# Patient Record
Sex: Female | Born: 1965 | Race: White | Hispanic: No | Smoking: Never smoker
Health system: Southern US, Community
[De-identification: ages and names within clinical notes are randomized; demographics above are authoritative.]

## PROBLEM LIST (undated history)

## (undated) DIAGNOSIS — R232 Flushing: Secondary | ICD-10-CM

## (undated) DIAGNOSIS — R519 Headache, unspecified: Secondary | ICD-10-CM

## (undated) DIAGNOSIS — Z9889 Other specified postprocedural states: Secondary | ICD-10-CM

## (undated) DIAGNOSIS — T4145XA Adverse effect of unspecified anesthetic, initial encounter: Secondary | ICD-10-CM

## (undated) DIAGNOSIS — R112 Nausea with vomiting, unspecified: Secondary | ICD-10-CM

## (undated) DIAGNOSIS — T8859XA Other complications of anesthesia, initial encounter: Secondary | ICD-10-CM

## (undated) DIAGNOSIS — J45909 Unspecified asthma, uncomplicated: Secondary | ICD-10-CM

## (undated) DIAGNOSIS — C50919 Malignant neoplasm of unspecified site of unspecified female breast: Secondary | ICD-10-CM

## (undated) DIAGNOSIS — IMO0002 Reserved for concepts with insufficient information to code with codable children: Secondary | ICD-10-CM

## (undated) DIAGNOSIS — R51 Headache: Secondary | ICD-10-CM

## (undated) DIAGNOSIS — R87619 Unspecified abnormal cytological findings in specimens from cervix uteri: Secondary | ICD-10-CM

## (undated) DIAGNOSIS — M1990 Unspecified osteoarthritis, unspecified site: Secondary | ICD-10-CM

## (undated) DIAGNOSIS — A630 Anogenital (venereal) warts: Secondary | ICD-10-CM

## (undated) DIAGNOSIS — S20219A Contusion of unspecified front wall of thorax, initial encounter: Secondary | ICD-10-CM

## (undated) DIAGNOSIS — K219 Gastro-esophageal reflux disease without esophagitis: Secondary | ICD-10-CM

## (undated) DIAGNOSIS — F419 Anxiety disorder, unspecified: Secondary | ICD-10-CM

## (undated) HISTORY — DX: Flushing: R23.2

## (undated) HISTORY — PX: CERVICAL CONE BIOPSY: SUR198

## (undated) HISTORY — DX: Anogenital (venereal) warts: A63.0

## (undated) HISTORY — DX: Headache, unspecified: R51.9

## (undated) HISTORY — PX: DILATION AND CURETTAGE OF UTERUS: SHX78

## (undated) HISTORY — DX: Unspecified abnormal cytological findings in specimens from cervix uteri: R87.619

## (undated) HISTORY — DX: Headache: R51

## (undated) HISTORY — DX: Unspecified osteoarthritis, unspecified site: M19.90

## (undated) HISTORY — DX: Reserved for concepts with insufficient information to code with codable children: IMO0002

## (undated) HISTORY — PX: CERVIX LESION DESTRUCTION: SHX591

## (undated) HISTORY — DX: Malignant neoplasm of unspecified site of unspecified female breast: C50.919

## (undated) HISTORY — PX: COLPOSCOPY: SHX161

---

## 1999-05-04 ENCOUNTER — Other Ambulatory Visit: Admission: RE | Admit: 1999-05-04 | Discharge: 1999-05-04 | Payer: Self-pay | Admitting: Obstetrics and Gynecology

## 1999-09-13 ENCOUNTER — Encounter: Payer: Self-pay | Admitting: Family Medicine

## 1999-09-13 ENCOUNTER — Ambulatory Visit (HOSPITAL_COMMUNITY): Admission: RE | Admit: 1999-09-13 | Discharge: 1999-09-13 | Payer: Self-pay | Admitting: Family Medicine

## 1999-09-22 ENCOUNTER — Encounter: Payer: Self-pay | Admitting: Emergency Medicine

## 1999-09-22 ENCOUNTER — Emergency Department (HOSPITAL_COMMUNITY): Admission: EM | Admit: 1999-09-22 | Discharge: 1999-09-22 | Payer: Self-pay | Admitting: Emergency Medicine

## 2000-08-21 ENCOUNTER — Other Ambulatory Visit: Admission: RE | Admit: 2000-08-21 | Discharge: 2000-08-21 | Payer: Self-pay | Admitting: Obstetrics and Gynecology

## 2000-09-25 ENCOUNTER — Other Ambulatory Visit: Admission: RE | Admit: 2000-09-25 | Discharge: 2000-09-25 | Payer: Self-pay | Admitting: Obstetrics and Gynecology

## 2001-07-15 ENCOUNTER — Other Ambulatory Visit: Admission: RE | Admit: 2001-07-15 | Discharge: 2001-07-15 | Payer: Self-pay | Admitting: Obstetrics and Gynecology

## 2003-05-17 ENCOUNTER — Other Ambulatory Visit: Admission: RE | Admit: 2003-05-17 | Discharge: 2003-05-17 | Payer: Self-pay | Admitting: Obstetrics and Gynecology

## 2003-11-22 ENCOUNTER — Other Ambulatory Visit: Admission: RE | Admit: 2003-11-22 | Discharge: 2003-11-22 | Payer: Self-pay | Admitting: Obstetrics and Gynecology

## 2004-01-27 ENCOUNTER — Other Ambulatory Visit: Admission: RE | Admit: 2004-01-27 | Discharge: 2004-01-27 | Payer: Self-pay | Admitting: Obstetrics and Gynecology

## 2004-06-13 ENCOUNTER — Other Ambulatory Visit: Admission: RE | Admit: 2004-06-13 | Discharge: 2004-06-13 | Payer: Self-pay | Admitting: Obstetrics and Gynecology

## 2004-06-15 ENCOUNTER — Ambulatory Visit (HOSPITAL_COMMUNITY): Admission: RE | Admit: 2004-06-15 | Discharge: 2004-06-15 | Payer: Self-pay | Admitting: Obstetrics and Gynecology

## 2004-07-04 ENCOUNTER — Encounter: Admission: RE | Admit: 2004-07-04 | Discharge: 2004-07-04 | Payer: Self-pay | Admitting: Obstetrics and Gynecology

## 2004-07-27 ENCOUNTER — Encounter (INDEPENDENT_AMBULATORY_CARE_PROVIDER_SITE_OTHER): Payer: Self-pay | Admitting: *Deleted

## 2004-07-27 ENCOUNTER — Ambulatory Visit (HOSPITAL_COMMUNITY): Admission: RE | Admit: 2004-07-27 | Discharge: 2004-07-27 | Payer: Self-pay | Admitting: Obstetrics and Gynecology

## 2004-12-18 ENCOUNTER — Other Ambulatory Visit: Admission: RE | Admit: 2004-12-18 | Discharge: 2004-12-18 | Payer: Self-pay | Admitting: Obstetrics and Gynecology

## 2005-04-17 ENCOUNTER — Other Ambulatory Visit: Admission: RE | Admit: 2005-04-17 | Discharge: 2005-04-17 | Payer: Self-pay | Admitting: Obstetrics and Gynecology

## 2005-08-22 ENCOUNTER — Other Ambulatory Visit: Admission: RE | Admit: 2005-08-22 | Discharge: 2005-08-22 | Payer: Self-pay | Admitting: Obstetrics and Gynecology

## 2005-08-23 ENCOUNTER — Ambulatory Visit (HOSPITAL_COMMUNITY): Admission: RE | Admit: 2005-08-23 | Discharge: 2005-08-23 | Payer: Self-pay | Admitting: Obstetrics and Gynecology

## 2010-02-13 ENCOUNTER — Ambulatory Visit: Payer: Self-pay | Admitting: Family Medicine

## 2010-02-13 DIAGNOSIS — J301 Allergic rhinitis due to pollen: Secondary | ICD-10-CM | POA: Insufficient documentation

## 2010-02-13 DIAGNOSIS — J069 Acute upper respiratory infection, unspecified: Secondary | ICD-10-CM | POA: Insufficient documentation

## 2010-02-13 DIAGNOSIS — J0100 Acute maxillary sinusitis, unspecified: Secondary | ICD-10-CM | POA: Insufficient documentation

## 2010-02-18 ENCOUNTER — Telehealth (INDEPENDENT_AMBULATORY_CARE_PROVIDER_SITE_OTHER): Payer: Self-pay | Admitting: *Deleted

## 2010-03-30 NOTE — Assessment & Plan Note (Signed)
Summary: SINUS PROBLEMS,PRESSURE/TJ rm 5   Vital Signs:  Patient Profile:   45 Years Old Female CC:      possible sinus infection Height:     69 inches Weight:      195 pounds O2 Sat:      100 % O2 treatment:    Room Air Temp:     99.1 degrees F oral Pulse rate:   77 / minute Resp:     18 per minute BP sitting:   156 / 97  (left arm) Cuff size:   regular  Vitals Entered By: Clemens Catholic LPN (February 13, 2010 5:58 PM)                  Updated Prior Medication List: CLARITIN 10 MG TABS (LORATADINE)   Current Allergies: ! SULFA ! * DUST ! * POLLENHistory of Present Illness Chief Complaint: possible sinus infection History of Present Illness:  Subjective: Patient complains of mild sore throat about 3 weeks ago, followed by an increase in her usual nasal congestion.  She has a history of perennial rhinitis, now not responding to Claritin.  She had had some pressure in her ears, and now has facial pain. + occasional cough No pleuritic pain No wheezing  + post-nasal drainage + sinus pain/pressure No itchy/red eyes ? earache No hemoptysis No SOB No fever/chills No nausea No vomiting No abdominal pain No diarrhea No skin rashes + fatigue No myalgias + headache Used OTC meds without relief   REVIEW OF SYSTEMS Constitutional Symptoms       Complains of fever, chills, and night sweats.     Denies weight loss, weight gain, and fatigue.  Eyes       Denies change in vision, eye pain, eye discharge, glasses, contact lenses, and eye surgery. Ear/Nose/Throat/Mouth       Complains of sinus problems and sore throat.      Denies hearing loss/aids, change in hearing, ear pain, ear discharge, dizziness, frequent runny nose, frequent nose bleeds, hoarseness, and tooth pain or bleeding.  Respiratory       Denies dry cough, productive cough, wheezing, shortness of breath, asthma, bronchitis, and emphysema/COPD.  Cardiovascular       Denies murmurs, chest pain, and tires  easily with exhertion.    Gastrointestinal       Denies stomach pain, nausea/vomiting, diarrhea, constipation, blood in bowel movements, and indigestion. Genitourniary       Denies painful urination, kidney stones, and loss of urinary control. Neurological       Denies paralysis, seizures, and fainting/blackouts. Musculoskeletal       Denies muscle pain, joint pain, joint stiffness, decreased range of motion, redness, swelling, muscle weakness, and gout.  Skin       Denies bruising, unusual mles/lumps or sores, and hair/skin or nail changes.  Psych       Denies mood changes, temper/anger issues, anxiety/stress, speech problems, depression, and sleep problems. Other Comments: pt c/o sinus pressure, fever, sore throat (drainage), green drainage,decreased appetite x 2-3wks. pt states that she has a hx of sinus infections. she has tried OTC Afrin with no help.   Past History:  Past Medical History: Unremarkable  Past Surgical History: Denies surgical history  Family History: mom- irregular heart rate  Social History: Never Smoked Alcohol use-yes 1 glass of wine per day Drug use-no Smoking Status:  never Drug Use:  no   Objective:  Appearance:  Patient appears healthy, stated age, and in no acute distress  Eyes:  Pupils are equal, round, and reactive to light and accomdation.  Extraocular movement is intact.  Conjunctivae are not inflamed.  Nose:  Normal septum.  Normal turbinates, mildly congested.  Maxillary sinus tenderness present.  Pharynx:  Normal  Neck:  Supple.  Slightly tender shotty anterior/posterior nodes are palpated bilaterally.  Lungs:  Clear to auscultation.  Breath sounds are equal.  Heart:  Regular rate and rhythm without murmurs, rubs, or gallops.  Abdomen:  Nontender without masses or hepatosplenomegaly.  Bowel sounds are present.  No CVA or flank tenderness.  Extremities:  No edema.   Skin:  No rash Assessment New Problems: ACUTE MAXILLARY SINUSITIS  (ICD-461.0) ALLERGIC RHINITIS, SEASONAL (ICD-477.0) UPPER RESPIRATORY INFECTION, ACUTE (ICD-465.9)  ACUTE VIRAL URI WITH SINUSITIS  Plan New Medications/Changes: BENZONATATE 200 MG CAPS (BENZONATATE) One by mouth hs as needed cough  #12 x 0, 02/13/2010, Donna Christen MD FLUTICASONE PROPIONATE 50 MCG/ACT SUSP (FLUTICASONE PROPIONATE) 2 sprays in each nostril once daily  #One x 1, 02/13/2010, Donna Christen MD AMOXICILLIN 875 MG TABS (AMOXICILLIN) One by mouth two times a day  #28 x 0, 02/13/2010, Donna Christen MD  New Orders: New Patient Level III [phone] Planning Comments:   Begin amoxicillin, expectorant/decongestant, fluticasone nasal spray, cough suppressant at bedtime.  Increase fluid intake Stop Claritin for now. Followup with PCP if not improving 10 to 14 days.   The patient and/or caregiver has been counseled thoroughly with regard to medications prescribed including dosage, schedule, interactions, rationale for use, and possible side effects and they verbalize understanding.  Diagnoses and expected course of recovery discussed and will return if not improved as expected or if the condition worsens. Patient and/or caregiver verbalized understanding.  Prescriptions: BENZONATATE 200 MG CAPS (BENZONATATE) One by mouth hs as needed cough  #12 x 0   Entered and Authorized by:   Donna Christen MD   Signed by:   Donna Christen MD on 02/13/2010   Method used:   Print then Give to Patient   RxID:   [id number] FLUTICASONE PROPIONATE 50 MCG/ACT SUSP (FLUTICASONE PROPIONATE) 2 sprays in each nostril once daily  #One x 1   Entered and Authorized by:   Donna Christen MD   Signed by:   Donna Christen MD on 02/13/2010   Method used:   Print then Give to Patient   RxID:   [id number] AMOXICILLIN 875 MG TABS (AMOXICILLIN) One by mouth two times a day  #28 x 0   Entered and Authorized by:   Donna Christen MD   Signed by:   Donna Christen MD on 02/13/2010   Method used:   Print then Give to  Patient   RxID:   [id number]   Patient Instructions: 1)  May use Mucinex D (guaifenesin with decongestant) twice daily for congestion. 2)  Increase fluid intake, rest. 3)  Stop Claritin for now. 4)  May use Afrin nasal spray (or generic oxymetazoline) twice daily for about 5 days.  Use the Afrin about 15 minutes prior to using fluticasone nasal spray.   Also recommend using saline nasal spray several times daily and/or saline nasal irrigation. 5)  Followup with family doctor if not improving 10 to 14 days.  Orders Added: 1)  New Patient Level III [47425]

## 2010-03-30 NOTE — Progress Notes (Signed)
  Phone Note Outgoing Call   Call placed by: Lajean Saver RN,  February 18, 2010 11:02 AM Call placed to: Patient Action Taken: Phone Call Completed Summary of Call: Patint called requesting inhaler. Rx called in to [facility] @ [phone]. Albuterol HFA. Patient notified of rx called in

## 2010-03-30 NOTE — Letter (Signed)
Summary: Out of Work  [facility]  [address]   [place], [place] [address]   Phone: [phone]  Fax: [phone]    February 13, 2010   Employee:  [name]    To Whom It May Concern:   For Medical reasons, please excuse the above named employee from work tomorrow.   If you need additional information, please feel free to contact our office.         Sincerely,    Donna Christen MD

## 2010-07-14 NOTE — H&P (Signed)
NAMEAMABEL, STMARIE             ACCOUNT NO.:  [id number]   MEDICAL RECORD NO.:  [id number]          PATIENT TYPE:  AMB   LOCATION:  [facility]                           FACILITY:  [facility]   PHYSICIAN:  Hal Morales, M.D.DATE OF BIRTH:  1966/02/02   DATE OF ADMISSION:  DATE OF DISCHARGE:                                HISTORY & PHYSICAL   HISTORY OF PRESENT ILLNESS:  Ms. 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  At that time, the patient was  offered cryotherapy for management; however, she declined wishing instead to  await the results of an additional Pap smear.  The patient had a repeat Pap  smear, in April 2006, which returned high grade squamous intraepithelial  lesion with atypical glandular cells.  It was then recommended to the  patient that she proceed with cold knife conization of the cervix to which  she has agreed.  STD testing was also recommended to the patient; however,  she declined  citing a long term monogamous relationship with negative  previous test results during that interim.   PAST MEDICAL HISTORY:   OBSTETRICAL HISTORY:  Gravida 2, para 2-0-0-2.  The patient had a son,  Samuel Bouche, in 41, and Mellody Dance in 1990.   GYNECOLOGIC HISTORY:  1.  Menarche, 45 years old.  The patient's menstrual periods are regular.  2.  She uses condoms for contraception.  3.  Does have a past history of abnormal Pap smears with colposcopy in 2002,      2004, and 2005.  4.  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  5.  The patient's last  mammogram/breast ultrasound was in May 2006, results      were normal.  6.  The patient's last Pap smear was April 2006, with results as outlined in      HPI.   MEDICAL HISTORY:  1.  Migraines.  2.  Pneumonia (2006).  3.  GERD.  4.  Uterine fibroids.   SURGICAL HISTORY:  Negative.  Denies any history of blood transfusion.   FAMILY HISTORY:  Positive for valvular heart disease, thyroid disease,  insulin-dependent diabetes, breast cancer (paternal aunt).   SOCIAL HISTORY:  The patient is divorced.  She works as a Mudlogger.   HABITS:  She does not use tobacco.  She consumes two alcoholic beverages  daily.   CURRENT MEDICATIONS:  1.  Multivitamins one tablet daily.  2.  Claritin 10 mg one tablet daily as needed.   ALLERGIES:  SULFA which causes severe hives.   REVIEW OF SYSTEMS:  The patient has anxiety which is related to her  impending surgery, pruritic vaginal irritation, otherwise  review of systems  is negative.   PHYSICAL EXAMINATION:  VITAL SIGNS:  Blood pressure 140/80, weight is 215.5  pounds, height is 5 feet 9 inches tall.  NECK:  Supple without masses.  There is no thyromegaly or adenopathy.  HEART:  Regular rate and rhythm.  There is no murmur.  LUNGS:  Clear.  There are no wheezes, rales, or rhonchi.  BACK:  No CVA tenderness.  ABDOMEN:  Bowel sounds are present.  It is soft without tenderness,  guarding, rebound, or organomegaly.  EXTREMITIES:  Without clubbing, cyanosis, or edema.  PELVIC:  Is taken from June 07, 2004 exam as the patient refuses exam today  because of menstrual period.  EG/BUS is within normal limits.  Vagina is  normal.  Cervix nontender without lesions.  The uterus is normal size,  shape, and consistency without tenderness.  Adnexa without tenderness or  masses.  Rectovaginal without tenderness or masses.   IMPRESSION:  1.  High grade squamous intraepithelial lesion with atypical glandular      cells.  2.  High risk  HPV.   DISPOSITION:  1.  A discussion was held with the patient regarding the indications for her      procedure along with its risks which include, but are not limited to,      reaction to anesthesia, damage to adjacent organs, infection, and      excessive bleeding.  2.  The patient has consented to proceed with a cold knife conization with      D&C at [facility] on July 27, 2004 at 10 a.m.  3.  She was further prescribed Xanax  0.5 mg to take one tablet three times      daily as needed for anxiety.  4.  She also was given a STAT dose of Diflucan 150 mg one tablet for her      vaginitis symptoms.       EJP/MEDQ  D:  07/21/2004  T:  07/21/2004  Job:  [id number]

## 2010-07-14 NOTE — Op Note (Signed)
NAMEKAROLYNE, Suarez             ACCOUNT NO.:  [id number]   MEDICAL RECORD NO.:  [id number]          PATIENT TYPE:  AMB   LOCATION:  [facility]                           [facility]  [facility]   PHYSICIAN:  Hal Morales, M.D.DATE OF BIRTH:  1965-08-19   DATE OF PROCEDURE:  07/27/2004  DATE OF DISCHARGE:                                 OPERATIVE REPORT   PREOPERATIVE DIAGNOSIS:  1.  High-grade squamous intraepithelial lesion.  2.  Atypical glandular cells.   POSTOPERATIVE DIAGNOSES:  1.  High-grade squamous intraepithelial lesion.  2.  Atypical glandular cells.   OPERATION:  1.  Cold knife conization of the cervix.  2.  Dilatation and curettage.   ANESTHESIA:  General orotracheal.   ESTIMATED BLOOD LOSS:  Less than 25 mL.   COMPLICATIONS:  None.   FINDINGS:  The cervix measured approximately 3 cm.  There was a large amount  of endometrial curettings at [facility].   PROCEDURE:  The patient was taken to the operating room after appropriate  identification and placed on the operating table.  After the attainment of  adequate general anesthesia, she was placed in the lithotomy position.  The  perineum and vagina were prepped with multiple layers of Betadine and draped  as a sterile field.  A red Robinson catheter was used to empty the bladder.  A weighted speculum was placed in the posterior vagina and a single-tooth  tenaculum placed on the anterior cervix outside the transition zone.  A paracervical block consisting of 10 mL of 2% Xylocaine at the 5 and 7  o'clock positions was placed and the cervix infiltrated with a total of 30  mL of dilute Pitressin.  Anchoring sutures were placed at the 3 and 9  o'clock positions and tied down and held.  The sound was used to measure the  cervix, which was 3 cm.  The total length of the uterus and cervix was 10  cm.  A knife was then used to remove a cone-shaped portion of cervix, which  included the entire length of the endocervical canal.  This was  marked with  a suture at the 12 o'clock position and removed from the operative field  once it had been cored out.  The internal os was then dilated to accommodate  a medium-sized curette, and this was used to curette all quadrants of the  uterus.  Prior to the endometrial curettings, curetting of the conization  bed at the level of the internal os was undertaken with minimal tissue  obtained.  Once the endometrial curettage was completed, the conization bed  was closed with Sturmdorf sutures at 6 and 12 o'clock and a hemostatic  suture at 9 and 3 o'clock.  Hemostasis was noted to be adequate, and a piece  of Gelfoam was placed in the conization bed.  All instruments were then  removed from the vagina and the patient awakened from general anesthesia,  then  taken to the recovery room in satisfactory condition having tolerated the  procedure, well with sponge and instrument counts correct.   SPECIMENS TO PATHOLOGY:  Conization, endometrial curettings and  endocervical  curettings.       VPH/MEDQ  D:  07/27/2004  T:  07/27/2004  Job:  [id number]

## 2010-07-14 NOTE — Op Note (Signed)
NAMEANANI, GU             ACCOUNT NO.:  [id number]   MEDICAL RECORD NO.:  [id number]          PATIENT TYPE:  AMB   LOCATION:  [facility]C                           [facility]   PHYSICIAN:  Hal Morales, M.D.DATE OF BIRTH:  1965-10-19   DATE OF PROCEDURE:  07/27/2004  DATE OF DISCHARGE:                                 OPERATIVE REPORT   PREOPERATIVE DIAGNOSIS:  Extensive bleeding, status post cold knife  conization of the cervix.   POSTOPERATIVE DIAGNOSIS:  Extensive bleeding, status post cold-knife  conization of the cervix.   OPERATION:  Suturing of the cervix to resolve bleeding, status post cold-  knife conization.   ANESTHESIA:  Monitored anesthesia care.   ESTIMATED BLOOD LOSS:  150 mL.   COMPLICATIONS:  Postop bleeding from cold-knife conization, requiring return  to operating room.   FINDINGS:  Generalized bleeding from the conization bed.   SPECIMENS:  None.   PREOPERATIVE NOTE:  I was called to the recovery room approximately 2-1/2  hours after the patient had completed her surgical procedure for cold-knife  conization and D&C.  I was told by the nurse that the patient was having  profuse bleeding after having spent the previous 2 hours without any  significant amount of bleeding.  She emphasized that the bleeding was  running down her legs and that she had significant clots.  The nurse was  instructed to return the patient to a PACU bed and obtain vital signs.  Upon  my arrival, the patient was noted to be alert, lying supine with the head  elevated, and complaining of some nausea.  She did, however, have stable  vital signs, and the blood pressure was in the 140/80 range with pulse in  the 80 range.  At that time, I discussed with the patient the need to  evaluate her vaginal bleeding and decided to initially try to do that in the  postoperative anesthesia care unit.  This was attempted with a Graves  speculum and sponge stick for identification of the  bleeding site and  placement of Monsel solution if possible; however, visualization was  difficult, and satisfactory visualization of the cervix could not be  obtained.  The plan was then made to return the patient to the operating  room and per her request, try not to use an anesthetic for her examination  and resolution of her postoperative bleeding.  She was thus returned to the  operating room.   PROCEDURE:  She was placed on the operating table after appropriate  identification and her legs were placed in the stirrups.  A Graves speculum  was used to visualize the cervix.  A pursestring suture of 0 chromic was  placed.  However, the suture broke before it could be adequately tied down,  and the decision was made to move to 0 Vicryl as the suture of choice.  Pursestring suture was placed; however, this did not achieve adequate  hemostasis and because of patient discomfort, a decision was made to proceed  with a monitored anesthesia care anesthetic.  Once this had been instituted,  the patient's  bladder was emptied and a four-pronged speculum placed in the  vagina for improved visualization.  Two additional pursestring sutures of 0  Vicryl were placed around the cervix with marked decrease in the amount of  bleeding but not complete hemostasis.  Sutures were placed in a mattress  fashion at the 2 o'clock position, 4 o'clock position, 8 o'clock, and 10  o'clock positions.  An additional suture was placed in a pursestring fashion  which allowed for adequate hemostasis.  A piece of Gelfoam was placed in the  conization bed after hemostasis had been achieved and the cervix observed  for several minutes prior to awakening the patient from her monitored  anesthesia care.  Prior to awakening her, all instruments were removed from  the vagina and once she had been awakened, she was taken to the recovery  room in satisfactory condition having tolerated the procedure well with  sponge and  instrument counts correct.  It was initially thought that the  patient should be observed overnight; however, the patient has a strong  desire to return home this evening and if she is able to continue her  postoperative recovery without significant nausea, she will be discharged  home with printed instructions from [facility].  Postoperative pain medication will be Ultram 50 mg p.o. q.6h. p.r.n. pain.  Follow-up will be in 2 weeks.   ADDENDUM:  Because of the significant bleeding from the conization bed, a  decision was made to ask for a rush diagnosis on the cervical conization,  and that should be available on the morning of July 28, 2004.       VPH/MEDQ  D:  07/27/2004  T:  07/28/2004  Job:  [id number]

## 2010-10-10 ENCOUNTER — Other Ambulatory Visit (HOSPITAL_COMMUNITY): Payer: Self-pay | Admitting: Obstetrics and Gynecology

## 2010-10-10 DIAGNOSIS — Z1231 Encounter for screening mammogram for malignant neoplasm of breast: Secondary | ICD-10-CM

## 2010-10-19 ENCOUNTER — Ambulatory Visit (HOSPITAL_COMMUNITY)
Admission: RE | Admit: 2010-10-19 | Discharge: 2010-10-19 | Disposition: A | Discharge status: Home / Self Care | Payer: BC Managed Care – PPO | Source: Ambulatory Visit | Attending: Obstetrics and Gynecology | Admitting: Obstetrics and Gynecology

## 2010-10-19 ENCOUNTER — Ambulatory Visit (HOSPITAL_COMMUNITY): Payer: BC Managed Care – PPO

## 2010-10-19 DIAGNOSIS — Z1231 Encounter for screening mammogram for malignant neoplasm of breast: Secondary | ICD-10-CM | POA: Insufficient documentation

## 2010-10-24 ENCOUNTER — Other Ambulatory Visit: Payer: Self-pay | Admitting: Obstetrics and Gynecology

## 2010-10-24 DIAGNOSIS — R928 Other abnormal and inconclusive findings on diagnostic imaging of breast: Secondary | ICD-10-CM

## 2010-11-02 ENCOUNTER — Ambulatory Visit
Admission: RE | Admit: 2010-11-02 | Discharge: 2010-11-02 | Disposition: A | Discharge status: Home / Self Care | Payer: BC Managed Care – PPO | Source: Ambulatory Visit | Attending: Obstetrics and Gynecology | Admitting: Obstetrics and Gynecology

## 2010-11-02 DIAGNOSIS — R928 Other abnormal and inconclusive findings on diagnostic imaging of breast: Secondary | ICD-10-CM

## 2012-02-24 ENCOUNTER — Emergency Department
Admission: EM | Admit: 2012-02-24 | Discharge: 2012-02-24 | Disposition: A | Discharge status: Home / Self Care | Payer: BC Managed Care – PPO | Source: Home / Self Care | Attending: Emergency Medicine | Admitting: Emergency Medicine

## 2012-02-24 DIAGNOSIS — R059 Cough, unspecified: Secondary | ICD-10-CM

## 2012-02-24 DIAGNOSIS — J209 Acute bronchitis, unspecified: Secondary | ICD-10-CM

## 2012-02-24 DIAGNOSIS — R05 Cough: Secondary | ICD-10-CM

## 2012-02-24 DIAGNOSIS — J069 Acute upper respiratory infection, unspecified: Secondary | ICD-10-CM

## 2012-02-24 HISTORY — DX: Contusion of unspecified front wall of thorax, initial encounter: S20.219A

## 2012-02-24 MED ORDER — AMOXICILLIN-POT CLAVULANATE 875-125 MG PO TABS: 1.0000 | ORAL_TABLET | Freq: Two times a day (BID) | ORAL | Status: DC

## 2012-02-24 MED ORDER — HYDROCODONE-HOMATROPINE 5-1.5 MG/5ML PO SYRP: 5.0000 mL | ORAL_SOLUTION | Freq: Four times a day (QID) | ORAL | Status: DC | PRN

## 2012-02-24 NOTE — ED Provider Notes (Signed)
History     CSN: [id number]  Arrival date & time 02/24/12  1303   First MD Initiated Contact with Patient 02/24/12 1356      Chief Complaint  Patient presents with  . Facial Pain    (Consider location/radiation/quality/duration/timing/severity/associated sxs/prior treatment) HPI Danielle Suarez is a 46 y.o. female who complains of onset of cold symptoms for a few weeks.  The symptoms are constant and mild-moderate in severity.  Taking OTC meds which are helping a bit.  Had a CXR a few days ago due to rib contusion and no evidence of PNA per patient. + sore throat + cough No pleuritic pain + nasal congestion + post-nasal drainage ++ sinus pain/pressure + chest congestion No itchy/red eyes No earache No hemoptysis No SOB No chills/sweats No fever No nausea No vomiting No abdominal pain No diarrhea No skin rashes + fatigue No myalgias + headache    Past Medical History  Diagnosis Date  . Bruised ribs     History reviewed. No pertinent past surgical history.  Family History  Problem Relation Age of Onset  . Diabetes Neg Hx   . Hypertension Neg Hx     History  Substance Use Topics  . Smoking status: Never Smoker   . Smokeless tobacco: Not on file  . Alcohol Use: No    OB History    Grav Para Term Preterm Abortions TAB SAB Ect Mult Living                  Review of Systems  All other systems reviewed and are negative.    Allergies  Pollen extract and Sulfonamide derivatives  Home Medications   Current Outpatient Rx  Name  Route  Sig  Dispense  Refill  . HYDROCODONE-ACETAMINOPHEN 10-325 MG PO TABS   Oral   Take 1 tablet by mouth every 6 (six) hours as needed.         . AMOXICILLIN-POT CLAVULANATE 875-125 MG PO TABS   Oral   Take 1 tablet by mouth 2 (two) times daily.   20 tablet   0   . HYDROCODONE-HOMATROPINE 5-1.5 MG/5ML PO SYRP   Oral   Take 5 mLs by mouth every 6 (six) hours as needed for cough.   120 mL   0     BP 132/81  Pulse  95  Temp 98.9 F (37.2 C) (Oral)  Resp 20  Ht 5\' 9"  (1.753 m)  Wt 212 lb (96.163 kg)  BMI 31.31 kg/m2  SpO2 98%  LMP 02/20/2012  Physical Exam  Nursing note and vitals reviewed. Constitutional: She is oriented to person, place, and time. She appears well-developed and well-nourished.  HENT:  Head: Normocephalic and atraumatic.  Right Ear: Tympanic membrane, external ear and ear canal normal.  Left Ear: Tympanic membrane, external ear and ear canal normal.  Nose: Mucosal edema and rhinorrhea present.  Mouth/Throat: Posterior oropharyngeal erythema present. No oropharyngeal exudate or posterior oropharyngeal edema.  Eyes: No scleral icterus.  Neck: Neck supple.  Cardiovascular: Regular rhythm and normal heart sounds.   Pulmonary/Chest: Effort normal. No respiratory distress. She has no decreased breath sounds. She has wheezes (bilateral mild). She has rhonchi (mild bilateral).  Neurological: She is alert and oriented to person, place, and time.  Skin: Skin is warm and dry.  Psychiatric: She has a normal mood and affect. Her speech is normal.    ED Course  Procedures (including critical care time)  Labs Reviewed - No data to display No results found.  1. Cough   2. Acute bronchitis   3. Acute upper respiratory infections of unspecified site       MDM  1)  Take the prescribed antibiotic as instructed.  If not improving, may need prednisone, but pt refused today.  Also can consider a CXR, but just had a normal one per pt. 2)  Use nasal saline solution (over the counter) at least 3 times a day. 3)  Use over the counter decongestants like Zyrtec-D every 12 hours as needed to help with congestion.  If you have hypertension, do not take medicines with sudafed.  4)  Can take tylenol every 6 hours or motrin every 8 hours for pain or fever. 5)  Follow up with your primary doctor if no improvement in 5-7 days, sooner if increasing pain, fever, or new symptoms.     Marlaine Hind, MD 02/24/12 [phone]

## 2012-02-24 NOTE — ED Notes (Signed)
URI symptoms started weeks ago, taking OTC meds which were effective, symptoms got worse around Christmas now with chest congesting and  wheezing

## 2012-02-26 ENCOUNTER — Telehealth: Payer: Self-pay | Admitting: Emergency Medicine

## 2012-04-22 ENCOUNTER — Ambulatory Visit: Payer: Self-pay | Admitting: Obstetrics and Gynecology

## 2012-10-29 ENCOUNTER — Encounter (HOSPITAL_COMMUNITY): Admission: RE | Payer: Self-pay | Source: Ambulatory Visit

## 2012-10-29 SURGERY — DILATATION & CURETTAGE/HYSTEROSCOPY WITH RESECTOCOPE
Anesthesia: Choice

## 2012-11-17 ENCOUNTER — Ambulatory Visit (HOSPITAL_COMMUNITY)
Admission: RE | Admit: 2012-11-17 | Payer: BC Managed Care – PPO | Source: Ambulatory Visit | Admitting: Obstetrics and Gynecology

## 2013-08-11 ENCOUNTER — Other Ambulatory Visit: Payer: Self-pay | Admitting: Radiology

## 2013-08-12 ENCOUNTER — Other Ambulatory Visit: Payer: Self-pay | Admitting: Radiology

## 2013-08-12 DIAGNOSIS — C50911 Malignant neoplasm of unspecified site of right female breast: Secondary | ICD-10-CM

## 2013-08-13 ENCOUNTER — Telehealth: Payer: Self-pay | Admitting: *Deleted

## 2013-08-13 DIAGNOSIS — C50411 Malignant neoplasm of upper-outer quadrant of right female breast: Secondary | ICD-10-CM | POA: Insufficient documentation

## 2013-08-13 NOTE — Telephone Encounter (Signed)
Confirmed [facility] for 08/19/13 at 0800.  Instructions and contact information given.

## 2013-08-18 ENCOUNTER — Ambulatory Visit
Admission: RE | Admit: 2013-08-18 | Discharge: 2013-08-18 | Disposition: A | Discharge status: Home / Self Care | Payer: BC Managed Care – PPO | Source: Ambulatory Visit | Attending: Radiology | Admitting: Radiology

## 2013-08-18 DIAGNOSIS — C50911 Malignant neoplasm of unspecified site of right female breast: Secondary | ICD-10-CM

## 2013-08-18 MED ORDER — GADOBENATE DIMEGLUMINE 529 MG/ML IV SOLN
15.0000 mL | Freq: Once | INTRAVENOUS | Status: AC | PRN
  Administered 2013-08-18: 15 mL via INTRAVENOUS

## 2013-08-19 ENCOUNTER — Ambulatory Visit (HOSPITAL_BASED_OUTPATIENT_CLINIC_OR_DEPARTMENT_OTHER): Payer: BC Managed Care – PPO | Admitting: General Surgery

## 2013-08-19 ENCOUNTER — Encounter: Payer: Self-pay | Admitting: Hematology and Oncology

## 2013-08-19 ENCOUNTER — Ambulatory Visit
Admission: RE | Admit: 2013-08-19 | Discharge: 2013-08-19 | Disposition: A | Discharge status: Home / Self Care | Payer: BC Managed Care – PPO | Source: Ambulatory Visit | Attending: Radiation Oncology | Admitting: Radiation Oncology

## 2013-08-19 ENCOUNTER — Other Ambulatory Visit: Payer: Self-pay | Admitting: Hematology and Oncology

## 2013-08-19 ENCOUNTER — Ambulatory Visit (HOSPITAL_BASED_OUTPATIENT_CLINIC_OR_DEPARTMENT_OTHER): Payer: BC Managed Care – PPO | Admitting: Hematology and Oncology

## 2013-08-19 ENCOUNTER — Other Ambulatory Visit (HOSPITAL_BASED_OUTPATIENT_CLINIC_OR_DEPARTMENT_OTHER): Payer: BC Managed Care – PPO

## 2013-08-19 ENCOUNTER — Telehealth: Payer: Self-pay | Admitting: Hematology and Oncology

## 2013-08-19 ENCOUNTER — Ambulatory Visit (HOSPITAL_BASED_OUTPATIENT_CLINIC_OR_DEPARTMENT_OTHER): Payer: BC Managed Care – PPO

## 2013-08-19 VITALS — BP 162/103 | HR 72 | Temp 98.5°F | Resp 20 | Ht 68.25 in | Wt 209.7 lb

## 2013-08-19 DIAGNOSIS — C50411 Malignant neoplasm of upper-outer quadrant of right female breast: Secondary | ICD-10-CM

## 2013-08-19 DIAGNOSIS — C50419 Malignant neoplasm of upper-outer quadrant of unspecified female breast: Secondary | ICD-10-CM

## 2013-08-19 DIAGNOSIS — C50919 Malignant neoplasm of unspecified site of unspecified female breast: Secondary | ICD-10-CM

## 2013-08-19 DIAGNOSIS — Z170 Estrogen receptor positive status [ER+]: Secondary | ICD-10-CM

## 2013-08-19 LAB — CBC WITH DIFFERENTIAL/PLATELET
BASO%: 0.7 % (ref 0.0–2.0)
Basophils Absolute: 0.1 10*3/uL (ref 0.0–0.1)
EOS%: 3.1 % (ref 0.0–7.0)
Eosinophils Absolute: 0.2 10*3/uL (ref 0.0–0.5)
HEMATOCRIT: 40.7 % (ref 34.8–46.6)
HGB: 13.7 g/dL (ref 11.6–15.9)
LYMPH#: 2.1 10*3/uL (ref 0.9–3.3)
LYMPH%: 27.8 % (ref 14.0–49.7)
MCH: 30.9 pg (ref 25.1–34.0)
MCHC: 33.6 g/dL (ref 31.5–36.0)
MCV: 92.1 fL (ref 79.5–101.0)
MONO#: 0.7 10*3/uL (ref 0.1–0.9)
MONO%: 8.9 % (ref 0.0–14.0)
NEUT#: 4.5 10*3/uL (ref 1.5–6.5)
NEUT%: 59.5 % (ref 38.4–76.8)
PLATELETS: 267 10*3/uL (ref 145–400)
RBC: 4.42 10*6/uL (ref 3.70–5.45)
RDW: 12.6 % (ref 11.2–14.5)
WBC: 7.6 10*3/uL (ref 3.9–10.3)

## 2013-08-19 LAB — COMPREHENSIVE METABOLIC PANEL (CC13)
ALK PHOS: 51 U/L (ref 40–150)
ALT: 19 U/L (ref 0–55)
AST: 18 U/L (ref 5–34)
Albumin: 3.8 g/dL (ref 3.5–5.0)
Anion Gap: 8 mEq/L (ref 3–11)
BUN: 15.3 mg/dL (ref 7.0–26.0)
CALCIUM: 9 mg/dL (ref 8.4–10.4)
CHLORIDE: 107 meq/L (ref 98–109)
CO2: 26 meq/L (ref 22–29)
CREATININE: 0.8 mg/dL (ref 0.6–1.1)
GLUCOSE: 113 mg/dL (ref 70–140)
POTASSIUM: 3.8 meq/L (ref 3.5–5.1)
Sodium: 141 mEq/L (ref 136–145)
TOTAL PROTEIN: 7 g/dL (ref 6.4–8.3)
Total Bilirubin: 0.32 mg/dL (ref 0.20–1.20)

## 2013-08-19 MED ORDER — ALPRAZOLAM 0.25 MG PO TABS: 0.2500 mg | ORAL_TABLET | Freq: Two times a day (BID) | ORAL | Status: DC | PRN

## 2013-08-19 NOTE — Telephone Encounter (Signed)
per pof to sch pt appt & chemp class-sch & gave pt copy of sch

## 2013-08-19 NOTE — Progress Notes (Signed)
Chief complaint:  New right breast cancer  HISTORY: Patient is a 48 year old female who presented with 2 palpable breast masses. She is referred for consultation regarding this by Dr. Everett Graff.  She underwent diagnostic imaging and was found to have a 2.6 cm lesion at 9:00 and a 1.4 cm lesion at 12:00. 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   The two lesions were biopsied and were similar. She appeared to have infiltrating mammary carcinoma that was ER and PR positive, HER-2 negative, Ki-67 of 10% on one biopsy in 20% in the other biopsy.    She does not have other medical problems. She has any surgeries. She takes her medication. She has no family history of cancer. She has had some cervical dysplasia in the past but this has resolved. She has 2 children, one age 50 and one age 82. She is a Public relations account executive. She does drink alcohol around 4-5 drinks per week. She does not smoke or use drugs.  She had menarche at age 74. She is still having periods and have one now. She does complain of heavy bleeding and has irregular periods. She has carried to children to term with the first at age 30. She has not used hormonal contraception. She has had a colonoscopy as well as a Pap smear last August. She has never had a bone density study.  Past Medical History  Diagnosis Date  . Bruised ribs   . Abnormal Pap smear   . Condylomata acuminata   . LGSIL (low grade squamous intraepithelial dysplasia)   . HGSIL (high grade squamous intraepithelial dysplasia)   . Breast cancer   . Arthritis   . Headache   . Hot flashes     Past Surgical History  Procedure Laterality Date  . Dilation and curettage of uterus    . Colposcopy      Current Outpatient Prescriptions  Medication Sig Dispense Refill  . b complex vitamins tablet Take 1 tablet by mouth daily.      Marland Kitchen loratadine (CLARITIN) 10 MG tablet Take 10 mg by mouth daily.      . Multiple Vitamin (MULTIVITAMIN) capsule  Take 1 capsule by mouth daily.       No current facility-administered medications for this visit.     Allergies  Allergen Reactions  . Sulfonamide Derivatives Hives  . Pollen Extract      Family History  Problem Relation Age of Onset  . Diabetes Neg Hx   . Hypertension Neg Hx      History   Social History  . Marital Status: Divorced    Spouse Name: N/A    Number of Children: N/A  . Years of Education: N/A   Social History Main Topics  . Smoking status: Never Smoker   . Smokeless tobacco: Never Used  . Alcohol Use: Yes  . Drug Use: No  . Sexual Activity: Yes   Other Topics Concern  . Not on file   Social History Narrative  . No narrative on file     REVIEW OF SYSTEMS - PERTINENT POSITIVES ONLY: 12 point review of systems negative other than HPI and PMH except for sciatic nerve pain, sinus issues, headaches, hot flashes.  EXAM: Wt Readings from Last 3 Encounters:  08/19/13 209 lb 11.2 oz (95.119 kg)  02/24/12 212 lb (96.163 kg)  02/13/10 195 lb (88.451 kg)   Temp Readings from Last 3 Encounters:  08/19/13 98.5 F (36.9 C) Oral  02/24/12  98.9 F (37.2 C) Oral   BP Readings from Last 3 Encounters:  08/19/13 162/103  02/24/12 132/81  02/13/10 156/97   Pulse Readings from Last 3 Encounters:  08/19/13 72  02/24/12 95  02/13/10 77     Wt Readings from Last 3 Encounters:  08/19/13 209 lb 11.2 oz (95.119 kg)  02/24/12 212 lb (96.163 kg)  02/13/10 195 lb (88.451 kg)     Gen:  No acute distress.  Well nourished and well groomed.   Neurological: Alert and oriented to person, place, and time. Coordination normal.  Head: Normocephalic and atraumatic.  Eyes: Conjunctivae are normal. Pupils are equal, round, and reactive to light. No scleral icterus.  Neck: Normal range of motion. Neck supple. No tracheal deviation or thyromegaly present.  Cardiovascular: Normal rate, regular rhythm, normal heart sounds and intact distal pulses.  Exam reveals no gallop  and no friction rub.  No murmur heard. Breast: 2 subtle masses found in the right breast at 9 o'clock and 12 o'clock.  No palpable adenopathy.  Right breast is slightly more dense that left overall. Subtle right nipple retraction.   No palpable abnormalities on the left.     Respiratory: Effort normal.  No respiratory distress. No chest wall tenderness. Breath sounds normal.  No wheezes, rales or rhonchi.  GI: Soft. Bowel sounds are normal. The abdomen is soft and nontender.  There is no rebound and no guarding.  Musculoskeletal: Normal range of motion. Extremities are nontender.  Lymphadenopathy: No cervical, preauricular, postauricular or axillary adenopathy is present Skin: Skin is warm and dry. No rash noted. No diaphoresis. No erythema. No pallor. No clubbing, cyanosis, or edema.   Psychiatric: Normal mood.  Slightly anxious. Behavior is normal. Judgment and thought content normal.    LABORATORY RESULTS: Available labs are reviewed   Recent Results (from the past 2160 hour(s))  COMPREHENSIVE METABOLIC PANEL (VF64)     Status: None   Collection Time    08/19/13  8:35 AM      Result Value Ref Range   Sodium 141  136 - 145 mEq/L   Potassium 3.8  3.5 - 5.1 mEq/L   Chloride 107  98 - 109 mEq/L   CO2 26  22 - 29 mEq/L   Glucose 113  70 - 140 mg/dl   BUN 15.3  7.0 - 26.0 mg/dL   Creatinine 0.8  0.6 - 1.1 mg/dL   Total Bilirubin 0.32  0.20 - 1.20 mg/dL   Alkaline Phosphatase 51  40 - 150 U/L   AST 18  5 - 34 U/L   ALT 19  0 - 55 U/L   Total Protein 7.0  6.4 - 8.3 g/dL   Albumin 3.8  3.5 - 5.0 g/dL   Calcium 9.0  8.4 - 10.4 mg/dL   Anion Gap 8  3 - 11 mEq/L  CBC WITH DIFFERENTIAL     Status: None   Collection Time    08/19/13  8:35 AM      Result Value Ref Range   WBC 7.6  3.9 - 10.3 10e3/uL   NEUT# 4.5  1.5 - 6.5 10e3/uL   HGB 13.7  11.6 - 15.9 g/dL   HCT 40.7  34.8 - 46.6 %   Platelets 267  145 - 400 10e3/uL   MCV 92.1  79.5 - 101.0 fL   MCH 30.9  25.1 - 34.0 pg   MCHC 33.6   31.5 - 36.0 g/dL   RBC 4.42  3.70 - 5.45  10e6/uL   RDW 12.6  11.2 - 14.5 %   lymph# 2.1  0.9 - 3.3 10e3/uL   MONO# 0.7  0.1 - 0.9 10e3/uL   Eosinophils Absolute 0.2  0.0 - 0.5 10e3/uL   Basophils Absolute 0.1  0.0 - 0.1 10e3/uL   NEUT% 59.5  38.4 - 76.8 %   LYMPH% 27.8  14.0 - 49.7 %   MONO% 8.9  0.0 - 14.0 %   EOS% 3.1  0.0 - 7.0 %   BASO% 0.7  0.0 - 2.0 %     RADIOLOGY RESULTS: See E-Chart or I-Site for most recent results.  Images and reports are reviewed.  Mr Breast Bilateral W Wo Contrast  08/18/2013   CLINICAL DATA:  Patient is a 48 year old female with recent diagnosis of invasive and in situ mammary carcinoma diagnosed by core needle biopsy in the right breast at 9 o'clock and 12 o'clock at an outside institution.  LABS:  No labs today  EXAM: BILATERAL BREAST MRI WITH AND WITHOUT CONTRAST  TECHNIQUE: Multiplanar, multisequence MR images of both breasts were obtained prior to and following the intravenous administration of 45ml of MultiHance.  THREE-DIMENSIONAL MR IMAGE RENDERING ON INDEPENDENT WORKSTATION:  Three-dimensional MR images were rendered by post-processing of the original MR data on an independent workstation. The three-dimensional MR images were interpreted, and findings are reported in the following complete MRI report for this study. Three dimensional images were evaluated at the independent DynaCad workstation  COMPARISON:  Previous exams  FINDINGS: Breast composition: Heterogeneous fibroglandular tissue.  Background parenchymal enhancement: There is marked background parenchymal enhancement with innumerable foci of non mass enhancement with benign features.  Right breast: Foci of signal dropout are identified in the outer right breast, one laterally at posterior depth and the other near midline at anterior depth. This is consistent with the two biopsy proven sites of malignancy in the are separated by approximately 6.9 cm. Interposed between the metal tissue markers, there  is regional heterogeneous and clumped abnormal non mass contrast enhancement consistent with biopsy-proven disease. This comprises a majority of the upper-outer quadrant of breast parenchyma extending into the lower outer quadrant and slightly into the lower inner quadrant. The abnormal contrast begins anteriorly involving the base of the right nipple and extends posteriorly by approximately 10 cm in AP diameter. Exact dimensions of this enhancement are difficult to determine, though it extends approximately 7.2 cm in transverse diameter and 4.6 cm in CC diameter.  Left breast: Innumerable foci of non mass enhancement with benign features. No abnormal mass or suspicious enhancement.  Lymph nodes: The axillary and internal mammary lymph node chains are symmetric and unremarkable.  Ancillary findings:  None.  IMPRESSION: 1. Biopsy-proven sites of malignancy in the outer right breast are separated by approximately 6.9 cm. Extensive associated heterogeneous non mass enhancement extends beyond the biopsy sites and involves the base of the right nipple extending posteriorly by approximately 10 cm, as above. 2. No MR findings of malignancy in the contralateral breast. 3. No axillary or internal mammary adenopathy.  RECOMMENDATION: Recommend continued surgical management  BI-RADS CATEGORY  6: Known biopsy-proven malignancy.   Electronically Signed   By: Andres Shad   On: 08/18/2013 12:21      ASSESSMENT AND PLAN: Breast cancer of upper-outer quadrant of right female breast This appears to be a clinical T3 N0 right breast cancer. Because of the diffuse nature of the abnormality calcifications, we do recommend a mastectomy on that side. She is not a  good candidate for immediate reconstruction.  She has a family vacation scheduled on July 4 for a week. We will schedule surgery at the first mutually available time. She'll need a right mastectomy, sentinel lymph node biopsy, and Port-A-Cath placement.  Since she has  no significant family history, and she is 20, we will not refer her to genetics. She may also need staging studies at the discretion of oncology.    I discussed the surgery with the patient. We reviewed the risks of bleeding, infection, damage to adjacent structures, numbness, need for prolonged drain, need for additional operations or procedures, recurrence of cancer, delayed reconstruction, cardiac or pulmonary complications, and risks with the Port-A-Cath such as pneumothorax, malposition, clot, hemothorax, or other.  She has a very active job. We asked her recovery. I advised that she would probably not go back to work with her drain.  She will likely need to be out for 3-4 weeks.    She understands and wishes to proceed.  45 min spent in evaluation, examination, counseling, and coordination of care.    Anxiety:   I will get her plugged in with one of the survivors to discuss her anxiety over her diagnosis. She may also need to see a psychologist, but at this point I think that she is having a normal reaction to her diagnosis.  Milus Height MD Surgical Oncology, General and [facility]      Visit Diagnoses: 1. Breast cancer of upper-outer quadrant of right female breast     Primary Care Physician: Eldred Manges, MD Thea Silversmith

## 2013-08-19 NOTE — Progress Notes (Signed)
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 Patient is seen in  multidisciplinary breast clinic today.  HPI Danielle Suarez is a 48 y.o. female whose diagnostic bilateral arm mammogram, which was done on 08/06/2013 revealed new cluster of heterogeneous calcifications in the right breast at 1:00 and also new cluster of pleomorphic effusions in the right breast at 10:00. And also irregular architectural distortion was noted in the right breast. Ultrasound of the right breast revealed 2.6 cm irregular mass in the right breast at 9:00 position and also 1.4 cm irregular mass in the right breast at 12:00 position associate with duct ectasia were noted. Subsequent biopsy of the 2 lesions performed on 08/11/2013 revealed invasive and in situ mammary carcinoma positive for ER- 92%, PR 91% and Ki-67 10% and negative for HER-2/neu amplification.  MRI of the  Breasts performed on 08/18/2013 revealed -Biopsy-proven sites of malignancy in the outer right breast are separated by approximately 6.9 cm. Extensive associated heterogeneous non mass enhancement extends beyond the biopsy sites and involves the base of the right nipple extending posteriorly by approximately 10 cm in AP diameter. She says that prior to her recent mammogram her last screening mammogram  was in 2012. At that time microcalcifications are present in both the breasts and questionable left breast mass and she underwent digital diagnostic mammogram and left breast ultrasound that revealed no evidence of any malignancy. Since then she did not pursue to get annual mammogram until her recent mammogram She has no  family history of any breast cancer or ovarian cancers. She only used 6 months of oral contraceptive pills when she was 48 years old Her first menstrual cycle when she was 48years old, she had her first child when she was 48 years old She denies any smoking. Drinks one to 2 wines daily  Past Medical History  Diagnosis Date  . Bruised ribs   . Abnormal Pap smear   . Condylomata acuminata   . LGSIL (low grade squamous intraepithelial dysplasia)   . HGSIL (high grade squamous intraepithelial dysplasia)   . Breast cancer   . Arthritis   . Headache   . Hot flashes     Past Surgical History  Procedure Laterality Date  . Dilation and curettage of uterus    . Colposcopy      Family History  Problem Relation Age of Onset  . Diabetes Neg Hx   . Hypertension Neg Hx     Social History History  Substance Use Topics  . Smoking status: Never Smoker   . Smokeless tobacco: Never Used  . Alcohol Use: Yes    Allergies  Allergen Reactions  . Sulfonamide Derivatives Hives  . Pollen Extract     Current Outpatient Prescriptions  Medication Sig Dispense Refill  . b complex vitamins tablet Take 1 tablet by mouth daily.      Marland Kitchen loratadine (CLARITIN) 10 MG tablet Take 10 mg by mouth daily.      . Multiple Vitamin (MULTIVITAMIN) capsule Take 1 capsule by mouth daily.       No current facility-administered medications for this visit.    Review of Systems A detailed 14 review of point systems is been assessed .she denies  any weight loss or decrease in appetite complains of constipation she says that her periods are irregular evaluated by gynecology and recent Pap smear was negative and she is up-to-date with her colonoscopy. She denies any shortness of breath, chest pain, palpitations, blood in the stool or blood in the urine. She denies any fever headaches dizziness blurred vision A comprehensive review of systems was otherwise negative.  Physical Exam  GENERAL:alert and oriented x3,  no  distress, well nourished and well developed SKIN: no rashes or significant lesions HEAD: Normocephalic, atraumatic EYES: PERRLA, EOMI, Conjunctiva are pink and non-injected, sclera clear EARS: External ears normal OROPHARYNX:no erythema, lips, buccal mucosa, and tongue normal and mucous membranes are moist  NECK: supple, no adenopathy, no JVD, no stridor, non-tender LYMPH:  no palpable lymphadenopathy, no hepatosplenomegaly BREAST: Right breast status post biopsy sites noted with mild tenderness.right nipple somewhat inverted with no nodules or puckering of the skin was noted. Left breast no masses appreciated . LUNGS: clear to auscultation , coarse sounds heard HEART: regular rate & rhythm ABDOMEN: soft, obese and normal bowel sounds BACK: symmetric, no curvature. EXTREMITIES:no edema, no clubbing and no cyanosis  NEURO: alert & oriented x 3 with fluent speech, no focal motor/sensory deficits, gait normal   PERFORMANCE STATUS: ECOG 0 - Asymptomatic   LABORATORY DATA: Lab Results  Component Value Date   WBC 7.6 08/19/2013   HGB 13.7 08/19/2013   HCT 40.7 08/19/2013   MCV 92.1 08/19/2013   PLT 267 08/19/2013      Chemistry      Component Value Date/Time   NA 141 08/19/2013 0835   K 3.8 08/19/2013 0835   CO2 26 08/19/2013 0835   BUN 15.3 08/19/2013 0835   CREATININE 0.8 08/19/2013 0835      Component Value Date/Time   CALCIUM 9.0 08/19/2013 0835   ALKPHOS 51 08/19/2013 0835   AST 18 08/19/2013 0835   ALT 19 08/19/2013 0835   BILITOT 0.32 08/19/2013 0835       RADIOGRAPHIC STUDIES: Mr Breast Bilateral W Wo Contrast  08/18/2013   CLINICAL DATA:  Patient is a 48 year old female with recent diagnosis of invasive and in situ mammary carcinoma diagnosed by core needle biopsy in the right breast at 9 o'clock and 12 o'clock at an outside institution.  LABS:  No labs today  EXAM: BILATERAL BREAST MRI WITH AND WITHOUT CONTRAST  TECHNIQUE: Multiplanar, multisequence MR images of both breasts  were obtained prior to and following the intravenous administration of 59ml of MultiHance.  THREE-DIMENSIONAL MR IMAGE RENDERING ON INDEPENDENT WORKSTATION:  Three-dimensional MR images were rendered by post-processing of the original MR data on an independent workstation. The three-dimensional MR images were interpreted, and findings are reported in the following complete MRI report for this study. Three dimensional images were evaluated at the independent DynaCad workstation  COMPARISON:  Previous exams  FINDINGS: Breast composition: Heterogeneous fibroglandular tissue.  Background parenchymal enhancement: There is marked background parenchymal enhancement with innumerable foci of non mass enhancement with benign features.  Right breast: Foci of signal dropout are identified in the outer right breast, one laterally at posterior depth and the other near midline at anterior depth. This is consistent with the two biopsy proven sites of malignancy in the are separated by approximately 6.9 cm. Interposed between the metal tissue markers, there is regional heterogeneous and clumped abnormal non mass contrast enhancement consistent with biopsy-proven disease. This comprises a majority of the upper-outer quadrant of breast parenchyma extending into the lower  outer quadrant and slightly into the lower inner quadrant. The abnormal contrast begins anteriorly involving the base of the right nipple and extends posteriorly by approximately 10 cm in AP diameter. Exact dimensions of this enhancement are difficult to determine, though it extends approximately 7.2 cm in transverse diameter and 4.6 cm in CC diameter.  Left breast: Innumerable foci of non mass enhancement with benign features. No abnormal mass or suspicious enhancement.  Lymph nodes: The axillary and internal mammary lymph node chains are symmetric and unremarkable.  Ancillary findings:  None.  IMPRESSION: 1. Biopsy-proven sites of malignancy in the outer right  breast are separated by approximately 6.9 cm. Extensive associated heterogeneous non mass enhancement extends beyond the biopsy sites and involves the base of the right nipple extending posteriorly by approximately 10 cm, as above. 2. No MR findings of malignancy in the contralateral breast. 3. No axillary or internal mammary adenopathy.  RECOMMENDATION: Recommend continued surgical management  BI-RADS CATEGORY  6: Known biopsy-proven malignancy.   Electronically Signed   By: Andres Shad   On: 08/18/2013 12:21    PATHOLOGY: Diagnosis; 08/11/2013 1. Breast, right, needle core biopsy, mass, 9 o'clock - INVASIVE AND IN SITU MAMMARY CARCINOMA. - SEE COMMENT. 2. Breast, right, needle core biopsy, mass, 12 o'clock - INVASIVE AND IN SITU MAMMARY CARCINOMA. - SEE COMMENT.  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 Patient is seen in  multidisciplinary breast clinic today.  Discussed in detail patient's case  today in breast cancer conference and in multidisciplinary breast clinic. We have decided to obtain PET scan for further staging.Patient desires to proceed with mastectomy.  She'll be scheduled for right breast mastectomy  after review of PET scan and will be getting chemotherapy port placement. She'll is scheduled for sentinel lymph node biopsy. Will  obtain 2-D echocardiogram to assess LV function prior to initiation of chemotherapy.   She'll followup with [place] in 3-4 weeks for adjuvant chemotherapy planning. If the PET scan reveals any distant metastasis she will followup with [place] earlier than her scheduled date for initiation of chemotherapy.  The patient voices understanding of current disease status and treatment options and is in agreement with the current care plan.  All questions were answered. The patient knows to call the clinic with any problems, questions  or concerns. We can certainly see the patient much sooner if necessary.  Thank you so much for allowing me to participate in the care of Dahlia Client. I will continue to follow up the patient with you and assist in her care.  I spent 50% of the time in counseling the patient face to face. The total time spent in the appointment was 50 minutes   [place], [place] [facility]  08/19/2013, 11:17 AM

## 2013-08-19 NOTE — Progress Notes (Signed)
Checked in new pt with no financial concerns at this time.  I talked about that [employer] and gave her a pamphlet on it.

## 2013-08-19 NOTE — Assessment & Plan Note (Addendum)
This appears to be a clinical T3 N0 right breast cancer. Because of the diffuse nature of the abnormality calcifications, we do recommend a mastectomy on that side. She is not a good candidate for immediate reconstruction.  She has a family vacation scheduled on July 4 for a week. We will schedule surgery at the first mutually available time. She'll need a right mastectomy, sentinel lymph node biopsy, and Port-A-Cath placement.  Since she has no significant family history, and she is 30, we will not refer her to genetics. She may also need staging studies at the discretion of oncology.    I discussed the surgery with the patient. We reviewed the risks of bleeding, infection, damage to adjacent structures, numbness, need for prolonged drain, need for additional operations or procedures, recurrence of cancer, delayed reconstruction, cardiac or pulmonary complications, and risks with the Port-A-Cath such as pneumothorax, malposition, clot, hemothorax, or other.  She has a very active job. We asked her recovery. I advised that she would probably not go back to work with her drain.  She will likely need to be out for 3-4 weeks.    She understands and wishes to proceed.  45 min spent in evaluation, examination, counseling, and coordination of care.

## 2013-08-19 NOTE — Progress Notes (Signed)
Pt desires mastectomy.  Has at least 2 masses with possible involvement of an area over 10 Cm. Will discuss post mastectomy radiation after final review of path. Is getting PET and port placement.

## 2013-08-20 ENCOUNTER — Encounter: Payer: Self-pay | Admitting: *Deleted

## 2013-08-20 NOTE — Progress Notes (Signed)
[facility]/Psychosocial Distress Screening Clinical Social Work  Clinical Social Work met with pt, her friend and mother at breast clinic to offer support and review distress screen .  The patient scored a 8 on the Psychosocial Distress Thermometer which indicates severe distress. Clinical Social Worker introduced self, explained CSW role, educated pt on [facility] support team and programs available to assist her. Pt very interested in attending support group and other programs. Pt shared her largest stressor has been adjusting to her diagnosis and the anxiety that has "taken things over". 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 CSW discussed the [employer] [employer] programs and [employer]. After meeting with team and CSW her anxiety is lessened, but still a concern. Pt was made aware of all resources here to assist and aware of availability to meet with CSW as well.    ONCBCN DISTRESS SCREENING 08/19/2013  Screening Type Initial Screening  Elta Gu[place] the number that describes how much distress you have been experiencing in the past week 8  Emotional problem type Nervousness/Anxiety;Adjusting to illness  Information Concerns Type Lack of info about treatment  Physical Problem type Nausea/vomiting;Constipation/diarrhea  Physician notified of physical symptoms Yes  Referral to clinical social work Yes  Referral to support programs Yes     Clinical Social Worker follow up needed: No. Pt aware of how to reach out to CSW and support team as needed. CSW will keep pt in mind as she begins her treatment as well.   Loren Racer, LCSW Clinical Social Worker [facility] [place] Wednesday, Thursday and Friday Phone: [phone] Fax: [phone]

## 2013-08-26 ENCOUNTER — Encounter (HOSPITAL_COMMUNITY)
Admission: RE | Admit: 2013-08-26 | Discharge: 2013-08-26 | Disposition: A | Discharge status: Home / Self Care | Payer: BC Managed Care – PPO | Source: Ambulatory Visit | Attending: Radiation Oncology | Admitting: Radiation Oncology

## 2013-08-26 ENCOUNTER — Telehealth: Payer: Self-pay | Admitting: Oncology

## 2013-08-26 ENCOUNTER — Encounter: Payer: Self-pay | Admitting: *Deleted

## 2013-08-26 ENCOUNTER — Telehealth: Payer: Self-pay | Admitting: *Deleted

## 2013-08-26 DIAGNOSIS — C50411 Malignant neoplasm of upper-outer quadrant of right female breast: Secondary | ICD-10-CM

## 2013-08-26 DIAGNOSIS — C50419 Malignant neoplasm of upper-outer quadrant of unspecified female breast: Secondary | ICD-10-CM | POA: Insufficient documentation

## 2013-08-26 LAB — GLUCOSE, CAPILLARY: GLUCOSE-CAPILLARY: 110 mg/dL — AB (ref 70–99)

## 2013-08-26 MED ORDER — FLUDEOXYGLUCOSE F - 18 (FDG) INJECTION
10.2000 | Freq: Once | INTRAVENOUS | Status: AC | PRN
  Administered 2013-08-26: 10.2 via INTRAVENOUS

## 2013-08-26 NOTE — Telephone Encounter (Signed)
Spoke to pt concerning [place] from 08/19/13.  Pt denies questions r/t dx. Pt does have questions about reconstruction after mastectomy.  Informed pt we will get a referral to one of our plastic surgeons. This RN sent message to Dr. Barry Dienes and her nurse to get referral. Confirmed PET scan and chemo class. Pt would like to speak to someone about feelings and emotions r/t breast cancer dx. Informed her that I will reach out to SW to speak to her. Gave pt encouragement and positive reinforcement. Encourage pt to call with further needs or concerns. Received verbal understanding. Contact information given.

## 2013-08-26 NOTE — Telephone Encounter (Signed)
, °

## 2013-08-27 ENCOUNTER — Other Ambulatory Visit (INDEPENDENT_AMBULATORY_CARE_PROVIDER_SITE_OTHER): Payer: Self-pay | Admitting: General Surgery

## 2013-08-27 DIAGNOSIS — C50911 Malignant neoplasm of unspecified site of right female breast: Secondary | ICD-10-CM

## 2013-09-01 ENCOUNTER — Encounter: Payer: Self-pay | Admitting: *Deleted

## 2013-09-01 NOTE — Progress Notes (Signed)
[facility] Work  Clinical Social Work was referred by patient navigator for assessment of psychosocial needs and to provide emotional support.  Clinical Social Worker attempted to contact patient at home to offer support and assess for needs.  CSW left message and will continue to try to reach out to Pt.    Loren Racer, LCSW Clinical Social Worker [facility] [place] [facility] [place] Wednesday, Thursday and Friday Phone: [phone] Fax: [phone]

## 2013-09-02 ENCOUNTER — Encounter: Payer: Self-pay | Admitting: *Deleted

## 2013-09-03 ENCOUNTER — Telehealth: Payer: Self-pay | Admitting: Hematology and Oncology

## 2013-09-03 ENCOUNTER — Other Ambulatory Visit (INDEPENDENT_AMBULATORY_CARE_PROVIDER_SITE_OTHER): Payer: Self-pay | Admitting: General Surgery

## 2013-09-03 NOTE — Telephone Encounter (Signed)
Per 09/02/13 POF lft msg for pt ov for 08/26 and cx for 07/22 w/NP, also mailed ltr to pt ........KJ

## 2013-09-04 ENCOUNTER — Telehealth (INDEPENDENT_AMBULATORY_CARE_PROVIDER_SITE_OTHER): Payer: Self-pay

## 2013-09-04 NOTE — Telephone Encounter (Signed)
Pt returned Bernie's call.  I advised pt Danielle Suarez was not available at the time and I would have Danielle Suarez return her call.  Pt verbalized understanding.  Anderson Malta

## 2013-09-04 NOTE — Telephone Encounter (Signed)
Pt is scheduled for right mastectomy w/ SLN injection on 09/29/13 by Dr. Barry Dienes.  She is anxious about waiting until 09/29/13, and needed reassurance that it is okay to wait an additional three weeks.  Pt was reassured and seemed to feel better after our conversation.  Dr. Barry Dienes made aware.

## 2013-09-04 NOTE — Telephone Encounter (Signed)
LMOV pt to call and ask for Pippa Hanif.   

## 2013-09-08 ENCOUNTER — Encounter: Payer: Self-pay | Admitting: *Deleted

## 2013-09-08 ENCOUNTER — Other Ambulatory Visit: Payer: BC Managed Care – PPO

## 2013-09-08 ENCOUNTER — Ambulatory Visit (HOSPITAL_COMMUNITY)
Admission: RE | Admit: 2013-09-08 | Discharge: 2013-09-08 | Disposition: A | Discharge status: Home / Self Care | Payer: BC Managed Care – PPO | Source: Ambulatory Visit | Attending: General Surgery | Admitting: General Surgery

## 2013-09-08 DIAGNOSIS — C50411 Malignant neoplasm of upper-outer quadrant of right female breast: Secondary | ICD-10-CM

## 2013-09-08 DIAGNOSIS — Z01818 Encounter for other preprocedural examination: Secondary | ICD-10-CM | POA: Insufficient documentation

## 2013-09-08 DIAGNOSIS — I517 Cardiomegaly: Secondary | ICD-10-CM

## 2013-09-08 DIAGNOSIS — C50419 Malignant neoplasm of upper-outer quadrant of unspecified female breast: Secondary | ICD-10-CM | POA: Insufficient documentation

## 2013-09-08 NOTE — Progress Notes (Signed)
  Echocardiogram 2D Echocardiogram has been performed.  Danielle Suarez 09/08/2013, 4:07 PM

## 2013-09-16 ENCOUNTER — Ambulatory Visit: Payer: BC Managed Care – PPO | Admitting: Adult Health

## 2013-09-16 ENCOUNTER — Other Ambulatory Visit (HOSPITAL_BASED_OUTPATIENT_CLINIC_OR_DEPARTMENT_OTHER): Payer: BC Managed Care – PPO

## 2013-09-16 DIAGNOSIS — C50411 Malignant neoplasm of upper-outer quadrant of right female breast: Secondary | ICD-10-CM

## 2013-09-16 DIAGNOSIS — C50919 Malignant neoplasm of unspecified site of unspecified female breast: Secondary | ICD-10-CM

## 2013-09-16 LAB — COMPREHENSIVE METABOLIC PANEL (CC13)
ALK PHOS: 50 U/L (ref 40–150)
ALT: 27 U/L (ref 0–55)
AST: 20 U/L (ref 5–34)
Albumin: 3.7 g/dL (ref 3.5–5.0)
Anion Gap: 9 mEq/L (ref 3–11)
BUN: 16.6 mg/dL (ref 7.0–26.0)
CALCIUM: 10.6 mg/dL — AB (ref 8.4–10.4)
CHLORIDE: 105 meq/L (ref 98–109)
CO2: 26 mEq/L (ref 22–29)
Creatinine: 0.8 mg/dL (ref 0.6–1.1)
Glucose: 93 mg/dl (ref 70–140)
Potassium: 4.2 mEq/L (ref 3.5–5.1)
Sodium: 139 mEq/L (ref 136–145)
Total Bilirubin: 0.22 mg/dL (ref 0.20–1.20)
Total Protein: 6.8 g/dL (ref 6.4–8.3)

## 2013-09-16 LAB — CBC WITH DIFFERENTIAL/PLATELET
BASO%: 0.3 % (ref 0.0–2.0)
Basophils Absolute: 0 10*3/uL (ref 0.0–0.1)
EOS%: 1 % (ref 0.0–7.0)
Eosinophils Absolute: 0.1 10*3/uL (ref 0.0–0.5)
HCT: 38.6 % (ref 34.8–46.6)
HGB: 12.9 g/dL (ref 11.6–15.9)
LYMPH#: 2.6 10*3/uL (ref 0.9–3.3)
LYMPH%: 24.4 % (ref 14.0–49.7)
MCH: 30.5 pg (ref 25.1–34.0)
MCHC: 33.4 g/dL (ref 31.5–36.0)
MCV: 91.1 fL (ref 79.5–101.0)
MONO#: 0.9 10*3/uL (ref 0.1–0.9)
MONO%: 8.5 % (ref 0.0–14.0)
NEUT#: 7 10*3/uL — ABNORMAL HIGH (ref 1.5–6.5)
NEUT%: 65.8 % (ref 38.4–76.8)
Platelets: 256 10*3/uL (ref 145–400)
RBC: 4.23 10*6/uL (ref 3.70–5.45)
RDW: 12.3 % (ref 11.2–14.5)
WBC: 10.6 10*3/uL — ABNORMAL HIGH (ref 3.9–10.3)

## 2013-09-17 ENCOUNTER — Telehealth: Payer: Self-pay | Admitting: *Deleted

## 2013-09-17 NOTE — Telephone Encounter (Signed)
Received call from patient requesting some information about getting treated in [place].  She states she loves it here but it would be much closer for her.  Informed her that she could be referred to Dr. Georgiann Cocker at [facility] and I would send Dr. Barry Dienes a message to let her know.  Encouraged patient to call with any needs or concerns.

## 2013-09-18 ENCOUNTER — Other Ambulatory Visit (INDEPENDENT_AMBULATORY_CARE_PROVIDER_SITE_OTHER): Payer: Self-pay | Admitting: General Surgery

## 2013-09-18 DIAGNOSIS — C50911 Malignant neoplasm of unspecified site of right female breast: Secondary | ICD-10-CM

## 2013-09-21 ENCOUNTER — Encounter (HOSPITAL_COMMUNITY): Payer: Self-pay | Admitting: Pharmacy Technician

## 2013-09-21 ENCOUNTER — Inpatient Hospital Stay (HOSPITAL_COMMUNITY)
Admission: RE | Admit: 2013-09-21 | Discharge: 2013-09-21 | Disposition: A | Discharge status: Home / Self Care | Payer: BC Managed Care – PPO | Source: Ambulatory Visit

## 2013-09-21 ENCOUNTER — Encounter (HOSPITAL_COMMUNITY): Payer: Self-pay

## 2013-09-21 HISTORY — DX: Anxiety disorder, unspecified: F41.9

## 2013-09-21 HISTORY — DX: Nausea with vomiting, unspecified: R11.2

## 2013-09-21 HISTORY — DX: Gastro-esophageal reflux disease without esophagitis: K21.9

## 2013-09-21 HISTORY — DX: Other complications of anesthesia, initial encounter: T88.59XA

## 2013-09-21 HISTORY — DX: Adverse effect of unspecified anesthetic, initial encounter: T41.45XA

## 2013-09-21 HISTORY — DX: Unspecified asthma, uncomplicated: J45.909

## 2013-09-21 HISTORY — DX: Other specified postprocedural states: Z98.890

## 2013-09-22 ENCOUNTER — Telehealth (INDEPENDENT_AMBULATORY_CARE_PROVIDER_SITE_OTHER): Payer: Self-pay | Admitting: *Deleted

## 2013-09-22 NOTE — Telephone Encounter (Signed)
LMOM for pt regarding appt with Dr. Georgiann Cocker, Med Oncologist. Advise pt it is scheduled for 10-14-13 arriving at 8:00 a.m.  [facility] [address] phone number [phone] Arlean Hopping

## 2013-09-28 MED ORDER — CEFAZOLIN SODIUM-DEXTROSE 2-3 GM-% IV SOLR
2.0000 g | INTRAVENOUS | Status: AC
  Administered 2013-09-29: 2 g via INTRAVENOUS
  Filled 2013-09-28: qty 50

## 2013-09-29 ENCOUNTER — Encounter (HOSPITAL_COMMUNITY)
Admission: RE | Admit: 2013-09-29 | Discharge: 2013-09-29 | Disposition: A | Discharge status: Home / Self Care | Payer: BC Managed Care – PPO | Source: Ambulatory Visit | Attending: General Surgery | Admitting: General Surgery

## 2013-09-29 ENCOUNTER — Encounter (HOSPITAL_COMMUNITY)
Admission: RE | Disposition: A | Discharge status: Home / Self Care | Payer: Self-pay | Source: Ambulatory Visit | Attending: General Surgery

## 2013-09-29 ENCOUNTER — Telehealth (INDEPENDENT_AMBULATORY_CARE_PROVIDER_SITE_OTHER): Payer: Self-pay | Admitting: General Surgery

## 2013-09-29 ENCOUNTER — Ambulatory Visit (HOSPITAL_COMMUNITY)
Admission: RE | Admit: 2013-09-29 | Discharge: 2013-09-30 | Disposition: A | Discharge status: Home / Self Care | Payer: BC Managed Care – PPO | Source: Ambulatory Visit | Attending: General Surgery | Admitting: General Surgery

## 2013-09-29 ENCOUNTER — Ambulatory Visit (HOSPITAL_COMMUNITY): Payer: BC Managed Care – PPO

## 2013-09-29 ENCOUNTER — Encounter (HOSPITAL_COMMUNITY): Payer: Self-pay | Admitting: Anesthesiology

## 2013-09-29 ENCOUNTER — Encounter (HOSPITAL_COMMUNITY): Payer: BC Managed Care – PPO | Admitting: Anesthesiology

## 2013-09-29 ENCOUNTER — Ambulatory Visit (HOSPITAL_COMMUNITY): Payer: BC Managed Care – PPO | Admitting: Anesthesiology

## 2013-09-29 DIAGNOSIS — K219 Gastro-esophageal reflux disease without esophagitis: Secondary | ICD-10-CM | POA: Diagnosis not present

## 2013-09-29 DIAGNOSIS — C50411 Malignant neoplasm of upper-outer quadrant of right female breast: Secondary | ICD-10-CM

## 2013-09-29 DIAGNOSIS — J45909 Unspecified asthma, uncomplicated: Secondary | ICD-10-CM | POA: Diagnosis not present

## 2013-09-29 DIAGNOSIS — C50919 Malignant neoplasm of unspecified site of unspecified female breast: Secondary | ICD-10-CM | POA: Diagnosis present

## 2013-09-29 DIAGNOSIS — D0590 Unspecified type of carcinoma in situ of unspecified breast: Secondary | ICD-10-CM

## 2013-09-29 DIAGNOSIS — F411 Generalized anxiety disorder: Secondary | ICD-10-CM | POA: Diagnosis not present

## 2013-09-29 DIAGNOSIS — C50911 Malignant neoplasm of unspecified site of right female breast: Secondary | ICD-10-CM

## 2013-09-29 DIAGNOSIS — D62 Acute posthemorrhagic anemia: Secondary | ICD-10-CM | POA: Diagnosis not present

## 2013-09-29 HISTORY — PX: MASTECTOMY W/ SENTINEL NODE BIOPSY: SHX2001

## 2013-09-29 HISTORY — PX: PORTACATH PLACEMENT: SHX2246

## 2013-09-29 HISTORY — PX: MASTECTOMY COMPLETE / SIMPLE W/ SENTINEL NODE BIOPSY: SUR846

## 2013-09-29 LAB — URINALYSIS, ROUTINE W REFLEX MICROSCOPIC
BILIRUBIN URINE: NEGATIVE
GLUCOSE, UA: NEGATIVE mg/dL
HGB URINE DIPSTICK: NEGATIVE
KETONES UR: NEGATIVE mg/dL
Leukocytes, UA: NEGATIVE
Nitrite: NEGATIVE
PROTEIN: NEGATIVE mg/dL
Specific Gravity, Urine: 1.019 (ref 1.005–1.030)
UROBILINOGEN UA: 0.2 mg/dL (ref 0.0–1.0)
pH: 7 (ref 5.0–8.0)

## 2013-09-29 SURGERY — MASTECTOMY WITH SENTINEL LYMPH NODE BIOPSY
Anesthesia: General | Site: Chest | Laterality: Right

## 2013-09-29 MED ORDER — SODIUM CHLORIDE 0.9 % IJ SOLN
INTRAMUSCULAR | Status: DC | PRN
  Administered 2013-09-29: 60 mL via INTRAVENOUS
  Administered 2013-09-29: 100 mL via INTRAVENOUS

## 2013-09-29 MED ORDER — FENTANYL CITRATE 0.05 MG/ML IJ SOLN
INTRAMUSCULAR | Status: AC
  Filled 2013-09-29: qty 5

## 2013-09-29 MED ORDER — OXYCODONE HCL 5 MG/5ML PO SOLN: 5.0000 mg | Freq: Once | ORAL | Status: DC | PRN

## 2013-09-29 MED ORDER — HEPARIN SOD (PORK) LOCK FLUSH 100 UNIT/ML IV SOLN
INTRAVENOUS | Status: DC | PRN
  Administered 2013-09-29: 500 [IU] via INTRAVENOUS

## 2013-09-29 MED ORDER — OXYCODONE-ACETAMINOPHEN 5-325 MG PO TABS
1.0000 | ORAL_TABLET | ORAL | Status: DC | PRN
  Administered 2013-09-29 – 2013-09-30 (×5): 2 via ORAL
  Filled 2013-09-29 (×5): qty 2

## 2013-09-29 MED ORDER — ONDANSETRON HCL 4 MG/2ML IJ SOLN
4.0000 mg | Freq: Once | INTRAMUSCULAR | Status: AC | PRN
  Administered 2013-09-29: 4 mg via INTRAVENOUS

## 2013-09-29 MED ORDER — BUPIVACAINE-EPINEPHRINE (PF) 0.5% -1:200000 IJ SOLN
INTRAMUSCULAR | Status: AC
  Filled 2013-09-29: qty 60

## 2013-09-29 MED ORDER — ONDANSETRON HCL 4 MG/2ML IJ SOLN
INTRAMUSCULAR | Status: DC | PRN
  Administered 2013-09-29: 4 mg via INTRAVENOUS

## 2013-09-29 MED ORDER — NEOSTIGMINE METHYLSULFATE 10 MG/10ML IV SOLN
INTRAVENOUS | Status: DC | PRN
  Administered 2013-09-29: 4.5 mg via INTRAVENOUS

## 2013-09-29 MED ORDER — ONDANSETRON HCL 4 MG/2ML IJ SOLN
4.0000 mg | Freq: Four times a day (QID) | INTRAMUSCULAR | Status: DC | PRN
  Administered 2013-09-29: 4 mg via INTRAVENOUS
  Filled 2013-09-29: qty 2

## 2013-09-29 MED ORDER — HYDROMORPHONE HCL PF 1 MG/ML IJ SOLN
INTRAMUSCULAR | Status: AC
  Filled 2013-09-29: qty 1

## 2013-09-29 MED ORDER — 0.9 % SODIUM CHLORIDE (POUR BTL) OPTIME
TOPICAL | Status: DC | PRN
  Administered 2013-09-29 (×2): 1000 mL

## 2013-09-29 MED ORDER — ROCURONIUM BROMIDE 50 MG/5ML IV SOLN
INTRAVENOUS | Status: AC
  Filled 2013-09-29: qty 1

## 2013-09-29 MED ORDER — SODIUM CHLORIDE 0.9 % IJ SOLN
INTRAMUSCULAR | Status: AC
  Filled 2013-09-29: qty 10

## 2013-09-29 MED ORDER — ALPRAZOLAM 0.25 MG PO TABS: 0.2500 mg | ORAL_TABLET | Freq: Two times a day (BID) | ORAL | Status: DC | PRN

## 2013-09-29 MED ORDER — BUPIVACAINE-EPINEPHRINE 0.5% -1:200000 IJ SOLN
INTRAMUSCULAR | Status: DC | PRN
  Administered 2013-09-29: 40 mL

## 2013-09-29 MED ORDER — LIDOCAINE HCL (CARDIAC) 20 MG/ML IV SOLN
INTRAVENOUS | Status: DC | PRN
  Administered 2013-09-29: 100 mg via INTRAVENOUS

## 2013-09-29 MED ORDER — SUCCINYLCHOLINE CHLORIDE 20 MG/ML IJ SOLN
INTRAMUSCULAR | Status: AC
Start: 2013-09-29 — End: 2013-09-29
  Filled 2013-09-29: qty 1

## 2013-09-29 MED ORDER — ONDANSETRON HCL 4 MG PO TABS: 4.0000 mg | ORAL_TABLET | Freq: Four times a day (QID) | ORAL | Status: DC | PRN

## 2013-09-29 MED ORDER — SUCCINYLCHOLINE CHLORIDE 20 MG/ML IJ SOLN
INTRAMUSCULAR | Status: DC | PRN
  Administered 2013-09-29: 140 mg via INTRAVENOUS

## 2013-09-29 MED ORDER — MIDAZOLAM HCL 5 MG/5ML IJ SOLN
INTRAMUSCULAR | Status: DC | PRN
  Administered 2013-09-29: 2 mg via INTRAVENOUS

## 2013-09-29 MED ORDER — OXYCODONE HCL 5 MG PO TABS: 5.0000 mg | ORAL_TABLET | Freq: Once | ORAL | Status: DC | PRN

## 2013-09-29 MED ORDER — MORPHINE SULFATE 2 MG/ML IJ SOLN
1.0000 mg | INTRAMUSCULAR | Status: DC | PRN
  Administered 2013-09-29: 2 mg via INTRAVENOUS
  Administered 2013-09-29: 4 mg via INTRAVENOUS
  Filled 2013-09-29: qty 2
  Filled 2013-09-29: qty 1

## 2013-09-29 MED ORDER — PANTOPRAZOLE SODIUM 40 MG PO TBEC
40.0000 mg | DELAYED_RELEASE_TABLET | Freq: Every day | ORAL | Status: DC
  Administered 2013-09-30: 40 mg via ORAL
  Filled 2013-09-29 (×2): qty 1

## 2013-09-29 MED ORDER — SCOPOLAMINE 1 MG/3DAYS TD PT72
MEDICATED_PATCH | TRANSDERMAL | Status: AC
  Filled 2013-09-29: qty 1

## 2013-09-29 MED ORDER — LABETALOL HCL 5 MG/ML IV SOLN
INTRAVENOUS | Status: AC
  Filled 2013-09-29: qty 4

## 2013-09-29 MED ORDER — IBUPROFEN 600 MG PO TABS
600.0000 mg | ORAL_TABLET | Freq: Four times a day (QID) | ORAL | Status: DC | PRN
  Administered 2013-09-30: 600 mg via ORAL
  Filled 2013-09-29: qty 1

## 2013-09-29 MED ORDER — ONDANSETRON HCL 4 MG/2ML IJ SOLN
INTRAMUSCULAR | Status: AC
  Filled 2013-09-29: qty 2

## 2013-09-29 MED ORDER — MIDAZOLAM HCL 2 MG/2ML IJ SOLN
INTRAMUSCULAR | Status: AC
  Filled 2013-09-29: qty 2

## 2013-09-29 MED ORDER — ROCURONIUM BROMIDE 100 MG/10ML IV SOLN
INTRAVENOUS | Status: DC | PRN
  Administered 2013-09-29: 30 mg via INTRAVENOUS
  Administered 2013-09-29 (×2): 10 mg via INTRAVENOUS

## 2013-09-29 MED ORDER — SODIUM CHLORIDE 0.9 % IR SOLN
Status: DC | PRN
  Administered 2013-09-29: 09:00:00

## 2013-09-29 MED ORDER — KCL IN DEXTROSE-NACL 20-5-0.45 MEQ/L-%-% IV SOLN
INTRAVENOUS | Status: AC
  Administered 2013-09-29 (×2): via INTRAVENOUS
  Filled 2013-09-29 (×2): qty 1000

## 2013-09-29 MED ORDER — MEPERIDINE HCL 25 MG/ML IJ SOLN: 6.2500 mg | INTRAMUSCULAR | Status: DC | PRN

## 2013-09-29 MED ORDER — ARTIFICIAL TEARS OP OINT
TOPICAL_OINTMENT | OPHTHALMIC | Status: AC
  Filled 2013-09-29: qty 3.5

## 2013-09-29 MED ORDER — LORATADINE 10 MG PO TABS
10.0000 mg | ORAL_TABLET | Freq: Every day | ORAL | Status: DC
  Administered 2013-09-30: 10 mg via ORAL
  Filled 2013-09-29 (×2): qty 1

## 2013-09-29 MED ORDER — LABETALOL HCL 5 MG/ML IV SOLN
INTRAVENOUS | Status: DC | PRN
  Administered 2013-09-29: 5 mg via INTRAVENOUS

## 2013-09-29 MED ORDER — HEPARIN SOD (PORK) LOCK FLUSH 100 UNIT/ML IV SOLN
INTRAVENOUS | Status: AC
  Filled 2013-09-29: qty 5

## 2013-09-29 MED ORDER — ARTIFICIAL TEARS OP OINT
TOPICAL_OINTMENT | OPHTHALMIC | Status: DC | PRN
  Administered 2013-09-29: 1 via OPHTHALMIC

## 2013-09-29 MED ORDER — BISMUTH SUBSALICYLATE 262 MG/15ML PO SUSP
30.0000 mL | Freq: Four times a day (QID) | ORAL | Status: DC | PRN
Start: 2013-09-29 — End: 2013-09-30
  Filled 2013-09-29: qty 236

## 2013-09-29 MED ORDER — DIPHENHYDRAMINE HCL 50 MG/ML IJ SOLN: 12.5000 mg | Freq: Four times a day (QID) | INTRAMUSCULAR | Status: DC | PRN

## 2013-09-29 MED ORDER — TECHNETIUM TC 99M SULFUR COLLOID FILTERED
1.0000 | Freq: Once | INTRAVENOUS | Status: AC | PRN
  Administered 2013-09-29: 1 via INTRADERMAL

## 2013-09-29 MED ORDER — DEXAMETHASONE SODIUM PHOSPHATE 4 MG/ML IJ SOLN
INTRAMUSCULAR | Status: AC
  Filled 2013-09-29: qty 1

## 2013-09-29 MED ORDER — WHITE PETROLATUM GEL
Status: AC
  Administered 2013-09-29: 0.2
  Filled 2013-09-29: qty 5

## 2013-09-29 MED ORDER — CEFAZOLIN SODIUM 1-5 GM-% IV SOLN
1.0000 g | Freq: Four times a day (QID) | INTRAVENOUS | Status: AC
  Administered 2013-09-29 – 2013-09-30 (×3): 1 g via INTRAVENOUS
  Filled 2013-09-29 (×3): qty 50

## 2013-09-29 MED ORDER — FENTANYL CITRATE 0.05 MG/ML IJ SOLN
INTRAMUSCULAR | Status: DC | PRN
  Administered 2013-09-29 (×3): 50 ug via INTRAVENOUS
  Administered 2013-09-29: 100 ug via INTRAVENOUS
  Administered 2013-09-29 (×5): 50 ug via INTRAVENOUS

## 2013-09-29 MED ORDER — BUPIVACAINE-EPINEPHRINE (PF) 0.25% -1:200000 IJ SOLN
INTRAMUSCULAR | Status: AC
  Filled 2013-09-29: qty 30

## 2013-09-29 MED ORDER — DEXAMETHASONE SODIUM PHOSPHATE 4 MG/ML IJ SOLN
INTRAMUSCULAR | Status: DC | PRN
  Administered 2013-09-29: 4 mg via INTRAVENOUS

## 2013-09-29 MED ORDER — SODIUM CHLORIDE 0.9 % IJ SOLN
INTRAMUSCULAR | Status: DC | PRN
  Administered 2013-09-29: 09:00:00

## 2013-09-29 MED ORDER — METHYLENE BLUE 1 % INJ SOLN
INTRAMUSCULAR | Status: AC
  Filled 2013-09-29: qty 10

## 2013-09-29 MED ORDER — EPHEDRINE SULFATE 50 MG/ML IJ SOLN
INTRAMUSCULAR | Status: AC
  Filled 2013-09-29: qty 1

## 2013-09-29 MED ORDER — NEOSTIGMINE METHYLSULFATE 10 MG/10ML IV SOLN
INTRAVENOUS | Status: AC
  Filled 2013-09-29: qty 1

## 2013-09-29 MED ORDER — HYDROMORPHONE HCL PF 1 MG/ML IJ SOLN
0.2500 mg | INTRAMUSCULAR | Status: DC | PRN
  Administered 2013-09-29 (×3): 0.5 mg via INTRAVENOUS

## 2013-09-29 MED ORDER — STERILE WATER FOR INJECTION IJ SOLN
INTRAMUSCULAR | Status: AC
  Filled 2013-09-29: qty 10

## 2013-09-29 MED ORDER — LACTATED RINGERS IV SOLN
INTRAVENOUS | Status: DC | PRN
  Administered 2013-09-29 (×2): via INTRAVENOUS

## 2013-09-29 MED ORDER — SCOPOLAMINE 1 MG/3DAYS TD PT72
MEDICATED_PATCH | TRANSDERMAL | Status: DC | PRN
  Administered 2013-09-29: 1 via TRANSDERMAL

## 2013-09-29 MED ORDER — GLYCOPYRROLATE 0.2 MG/ML IJ SOLN
INTRAMUSCULAR | Status: AC
  Filled 2013-09-29: qty 4

## 2013-09-29 MED ORDER — PROPOFOL 10 MG/ML IV BOLUS
INTRAVENOUS | Status: AC
  Filled 2013-09-29: qty 20

## 2013-09-29 MED ORDER — PROPOFOL 10 MG/ML IV BOLUS
INTRAVENOUS | Status: DC | PRN
  Administered 2013-09-29: 30 mg via INTRAVENOUS
  Administered 2013-09-29: 20 mg via INTRAVENOUS
  Administered 2013-09-29: 150 mg via INTRAVENOUS

## 2013-09-29 MED ORDER — ACETAMINOPHEN 325 MG PO TABS: 325.0000 mg | ORAL_TABLET | Freq: Four times a day (QID) | ORAL | Status: DC | PRN

## 2013-09-29 MED ORDER — GLYCOPYRROLATE 0.2 MG/ML IJ SOLN
INTRAMUSCULAR | Status: DC | PRN
  Administered 2013-09-29: .8 mg via INTRAVENOUS

## 2013-09-29 MED ORDER — LIDOCAINE HCL (CARDIAC) 20 MG/ML IV SOLN
INTRAVENOUS | Status: AC
  Filled 2013-09-29: qty 5

## 2013-09-29 MED ORDER — LIDOCAINE HCL (PF) 1 % IJ SOLN
INTRAMUSCULAR | Status: AC
  Filled 2013-09-29: qty 30

## 2013-09-29 SURGICAL SUPPLY — 86 items
BAG DECANTER FOR FLEXI CONT (MISCELLANEOUS) ×3 IMPLANT
BINDER BREAST LRG (GAUZE/BANDAGES/DRESSINGS) IMPLANT
BINDER BREAST XLRG (GAUZE/BANDAGES/DRESSINGS) ×3 IMPLANT
BLADE SURG 11 STRL SS (BLADE) ×3 IMPLANT
BLADE SURG 15 STRL LF DISP TIS (BLADE) ×2 IMPLANT
BLADE SURG 15 STRL SS (BLADE) ×1
BNDG COHESIVE 4X5 TAN STRL (GAUZE/BANDAGES/DRESSINGS) ×3 IMPLANT
CANISTER SUCTION 2500CC (MISCELLANEOUS) ×6 IMPLANT
CHLORAPREP W/TINT 10.5 ML (MISCELLANEOUS) IMPLANT
CHLORAPREP W/TINT 26ML (MISCELLANEOUS) ×3 IMPLANT
CLIP TI MEDIUM 24 (CLIP) ×3 IMPLANT
CLIP TI MEDIUM 6 (CLIP) ×9 IMPLANT
CLIP TI WIDE RED SMALL 6 (CLIP) ×3 IMPLANT
CONT SPEC 4OZ CLIKSEAL STRL BL (MISCELLANEOUS) ×15 IMPLANT
COVER PROBE W GEL 5X96 (DRAPES) ×3 IMPLANT
COVER SURGICAL LIGHT HANDLE (MISCELLANEOUS) ×3 IMPLANT
COVER TRANSDUCER ULTRASND (DRAPES) IMPLANT
COVER TRANSDUCER ULTRASND GEL (DRAPE) IMPLANT
CRADLE DONUT ADULT HEAD (MISCELLANEOUS) ×3 IMPLANT
DERMABOND ADVANCED (GAUZE/BANDAGES/DRESSINGS) ×3
DERMABOND ADVANCED .7 DNX12 (GAUZE/BANDAGES/DRESSINGS) ×6 IMPLANT
DEVICE DISSECT PLASMABLAD 3.0S (MISCELLANEOUS) ×2 IMPLANT
DRAIN CHANNEL 19F RND (DRAIN) ×6 IMPLANT
DRAPE C-ARM 42X72 X-RAY (DRAPES) ×3 IMPLANT
DRAPE CHEST BREAST 15X10 FENES (DRAPES) ×3 IMPLANT
DRAPE UTILITY 15X26 W/TAPE STR (DRAPE) ×6 IMPLANT
DRAPE WARM FLUID 44X44 (DRAPE) IMPLANT
DRSG PAD ABDOMINAL 8X10 ST (GAUZE/BANDAGES/DRESSINGS) ×9 IMPLANT
ELECT CAUTERY BLADE 6.4 (BLADE) ×3 IMPLANT
ELECT COATED BLADE 2.86 ST (ELECTRODE) ×3 IMPLANT
ELECT REM PT RETURN 9FT ADLT (ELECTROSURGICAL) ×6
ELECTRODE REM PT RTRN 9FT ADLT (ELECTROSURGICAL) ×4 IMPLANT
EVACUATOR SILICONE 100CC (DRAIN) ×6 IMPLANT
GAUZE SPONGE 4X4 16PLY XRAY LF (GAUZE/BANDAGES/DRESSINGS) ×3 IMPLANT
GLOVE BIO SURGEON STRL SZ 6 (GLOVE) ×6 IMPLANT
GLOVE BIOGEL PI IND STRL 6.5 (GLOVE) ×4 IMPLANT
GLOVE BIOGEL PI IND STRL 7.0 (GLOVE) ×6 IMPLANT
GLOVE BIOGEL PI IND STRL 7.5 (GLOVE) ×4 IMPLANT
GLOVE BIOGEL PI INDICATOR 6.5 (GLOVE) ×2
GLOVE BIOGEL PI INDICATOR 7.0 (GLOVE) ×3
GLOVE BIOGEL PI INDICATOR 7.5 (GLOVE) ×2
GLOVE ECLIPSE 7.5 STRL STRAW (GLOVE) ×6 IMPLANT
GLOVE SURG SS PI 7.0 STRL IVOR (GLOVE) ×6 IMPLANT
GOWN STRL REUS W/ TWL LRG LVL3 (GOWN DISPOSABLE) ×6 IMPLANT
GOWN STRL REUS W/TWL 2XL LVL3 (GOWN DISPOSABLE) ×6 IMPLANT
GOWN STRL REUS W/TWL LRG LVL3 (GOWN DISPOSABLE) ×3
KIT BASIN OR (CUSTOM PROCEDURE TRAY) ×3 IMPLANT
KIT PORT POWER 8FR ISP CVUE (Catheter) ×3 IMPLANT
KIT PORT POWER 9.6FR MRI PREA (Catheter) IMPLANT
KIT PORT POWER ISP 8FR (Catheter) IMPLANT
KIT POWER CATH 8FR (Catheter) IMPLANT
KIT ROOM TURNOVER OR (KITS) ×3 IMPLANT
MARKER SKIN DUAL TIP RULER LAB (MISCELLANEOUS) ×3 IMPLANT
NEEDLE 18GX1X1/2 (RX/OR ONLY) (NEEDLE) ×3 IMPLANT
NEEDLE 25GAX1.5 (MISCELLANEOUS) ×9 IMPLANT
NEEDLE SPNL 22GX3.5 QUINCKE BK (NEEDLE) ×3 IMPLANT
NS IRRIG 1000ML POUR BTL (IV SOLUTION) ×3 IMPLANT
PACK GENERAL/GYN (CUSTOM PROCEDURE TRAY) ×3 IMPLANT
PACK SURGICAL SETUP 50X90 (CUSTOM PROCEDURE TRAY) IMPLANT
PACK UNIVERSAL I (CUSTOM PROCEDURE TRAY) ×3 IMPLANT
PAD ARMBOARD 7.5X6 YLW CONV (MISCELLANEOUS) ×6 IMPLANT
PENCIL BUTTON HOLSTER BLD 10FT (ELECTRODE) ×3 IMPLANT
PLASMABLADE 3.0S (MISCELLANEOUS) ×3
SPECIMEN JAR X LARGE (MISCELLANEOUS) ×3 IMPLANT
SPONGE GAUZE 4X4 12PLY STER LF (GAUZE/BANDAGES/DRESSINGS) ×3 IMPLANT
SPONGE LAP 18X18 X RAY DECT (DISPOSABLE) ×12 IMPLANT
STAPLER VISISTAT 35W (STAPLE) IMPLANT
STOCKINETTE IMPERVIOUS 9X36 MD (GAUZE/BANDAGES/DRESSINGS) ×3 IMPLANT
STRIP CLOSURE SKIN 1/2X4 (GAUZE/BANDAGES/DRESSINGS) IMPLANT
SUT ETHILON 2 0 FS 18 (SUTURE) ×6 IMPLANT
SUT MON AB 4-0 PC3 18 (SUTURE) ×6 IMPLANT
SUT PROLENE 2 0 SH DA (SUTURE) ×6 IMPLANT
SUT SILK 2 0 (SUTURE)
SUT SILK 2 0 FS (SUTURE) ×3 IMPLANT
SUT SILK 2-0 18XBRD TIE 12 (SUTURE) IMPLANT
SUT VIC AB 3-0 SH 27 (SUTURE) ×1
SUT VIC AB 3-0 SH 27X BRD (SUTURE) ×2 IMPLANT
SUT VIC AB 3-0 SH 8-18 (SUTURE) ×9 IMPLANT
SYR 20ML ECCENTRIC (SYRINGE) ×6 IMPLANT
SYR 50ML LL SCALE MARK (SYRINGE) ×3 IMPLANT
SYR 5ML LUER SLIP (SYRINGE) ×3 IMPLANT
SYR CONTROL 10ML LL (SYRINGE) ×6 IMPLANT
TOWEL OR 17X24 6PK STRL BLUE (TOWEL DISPOSABLE) ×3 IMPLANT
TOWEL OR 17X26 10 PK STRL BLUE (TOWEL DISPOSABLE) ×3 IMPLANT
TUBE CONNECTING 12X1/4 (SUCTIONS) ×3 IMPLANT
YANKAUER SUCT BULB TIP NO VENT (SUCTIONS) IMPLANT

## 2013-09-29 NOTE — Op Note (Signed)
Right Mastectomy with Sentinel Node mapping and Biopsy, left subclavian port a cath placement  Indications: This patient presents with history of right breast cancer with clinically negative axillary lymph node exam.  Pre-operative Diagnosis: right breast cancer, cT3N0M0 mammary phenotype  Post-operative Diagnosis: right breast cancer, same  Surgeon: Stark Klein   Anesthesia: General endotracheal anesthesia and Local anesthesia 0.25.% bupivacaine, with epinephrine  ASA Class: 2  Procedure Details  The patient was seen in the Holding Room. The risks, benefits, complications, treatment options, and expected outcomes were discussed with the patient. The possibilities of reaction to medication, pulmonary aspiration, bleeding, infection, the need for additional procedures, failure to diagnose a condition, and creating a complication requiring transfusion or operation were discussed with the patient. The patient concurred with the proposed plan, giving informed consent.  The site of surgery properly noted/marked. The patient was taken to Operating Room # , identified as Danielle Suarez and the procedure verified as Right Mastectomy and Sentinel Node Biopsy, port a cath placement. A Time Out was held and the above information confirmed.  The methylene blue was injected in the subareolar location.    Local anesthetic was administered over this   area at the angle of the clavicle.  The vein was accessed with 1 pass of the needle. There was good venous return and the wire passed easily with no ectopy.   Fluoroscopy was used to confirm that the wire was in the vena cava.      The patient was placed back level and the area for the pocket was anethetized   with local anesthetic.  A 3-cm transverse incision was made with a #15   blade.  Cautery was used to divide the subcutaneous tissues down to the   pectoralis muscle.  An [employer] retractor was used to elevate the skin   while a pocket was created on  top of the pectoralis fascia.  The port   was placed into the pocket to confirm that it was of adequate size.  The   catheter was preattached to the port.  The port was then secured to the   pectoralis fascia with four 2-0 Prolene sutures.  These were clamped and   not tied down yet.    The catheter was tunneled through to the wire exit   site.  The catheter was placed along the wire to determine what length it should be to be in the SVC.  The catheter was cut at 22 cm.  The tunneler sheath and dilator were passed over the wire and the dilator and wire were removed.  The catheter was advanced through the tunneler sheath and the tunneler sheath was pulled away.  Care was taken to keep the catheter in the tunneler sheath as this occurred. This was advanced and the tunneler sheath was removed.  There was good venous   return and easy flush of the catheter.  The Prolene sutures were tied   down to the pectoral fascia.  The skin was reapproximated using 3-0   Vicryl interrupted deep dermal sutures.    Fluoroscopy was used to re-confirm good position of the catheter.  The skin   was then closed using 4-0 Monocryl in a subcuticular fashion.  The port was flushed with concentrated heparin flush as well.  The wounds were then cleaned, dried, and dressed with Dermabond.  The patient was awakened from anesthesia and taken to the PACU in stable condition.  Needle, sponge, and instrument counts were correct  After induction  of anesthesia, the bilateral arm, breast, and chest were prepped and draped in standard fashion.  The borders of the breast were identified and marked.  The incisions of the breast were drawn out to make sure incision lines were equidistant in length.  This was incorporated into the incision.  The superior incision was made with the PlasmaBlade.  The marcaine/saline mixture was infiltrated into the superior flap.  Mastectomy hooks were used to provide elevation of the skin edges, and the  PlasmaBlade was used to create the mastectomy flaps.  The dissection was taken to the fascia of the pectoralis major.  The penetrating vessels were clipped.  The superior flap was taken medially to the lateral sternal border, superiorly to the inferior border of the clavicle.  The inferior flap was similarly created, inferiorly to the inframammary fold and laterally to the border of the latissimus.  The breast was taken off including the pectoralis fascia and the axillary tail marked.    Using a hand-held gamma probe, axillary sentinel nodes were identified.  5 level one and two axillary sentinel nodes were removed and submitted to pathology.  The findings are below.  The lymphovascular channels were clipped with metal clips.        The wound was irrigated.   Two 19 Blake drains were placed laterally.   Hemostasis was achieved with cautery.  The wound was irrigated and closed with a 3-0 Vicryl deep dermal interrupted sutures and 4-0 Vicryl subcuticular closure in layers.    Sterile dressings were applied. At the end of the operation, all sponge, instrument, and needle counts were correct.  Findings: grossly clear surgical margins, 5 axillary sentinel nodes, #1 palpable, #2 150 cps/hot/blue, #3 2800 cps/hot/blue, #4 400 cps/hot, #5 2376 cps/hot only.    Estimated Blood Loss:  100 mL         Drains: 2 19 Blakes and an OnQ                Specimens: R breast and 5 axillary sentinel nodes         Complications:  None; patient tolerated the procedure well.         Disposition: PACU - hemodynamically stable.         Condition: stable

## 2013-09-29 NOTE — Discharge Instructions (Signed)
[facility] °[phone] ° °MASTECTOMY: POST OP INSTRUCTIONS ° °Always review your discharge instruction sheet given to you by the facility where your surgery was performed. °IF YOU HAVE DISABILITY OR FAMILY LEAVE FORMS, YOU MUST BRING THEM TO THE OFFICE FOR PROCESSING.   °DO NOT GIVE THEM TO YOUR DOCTOR. °A prescription for pain medication may be given to you upon discharge.  Take your pain medication as prescribed, if needed.  If narcotic pain medicine is not needed, then you may take acetaminophen (Tylenol) or ibuprofen (Advil) as needed. °1. Take your usually prescribed medications unless otherwise directed. °2. If you need a refill on your pain medication, please contact your pharmacy.  They will contact our office to request authorization.  Prescriptions will not be filled after 5pm or on week-ends. °3. You should follow a light diet the first few days after arrival home, such as soup and crackers, etc.  Resume your normal diet the day after surgery. °4. Most patients will experience some swelling and bruising on the chest and underarm.  Ice packs will help.  Swelling and bruising can take several days to resolve.  °5. It is common to experience some constipation if taking pain medication after surgery.  Increasing fluid intake and taking a stool softener (such as Colace) will usually help or prevent this problem from occurring.  A mild laxative (Milk of Magnesia or Miralax) should be taken according to package instructions if there are no bowel movements after 48 hours. °6. Unless discharge instructions indicate otherwise, leave your bandage dry and in place until your next appointment in 3-5 days.  You may take a limited sponge bath.  No tube baths or showers until the drains are removed.  You may have steri-strips (small skin tapes) in place directly over the incision.  These strips should be left on the skin for 7-10 days.  If your surgeon used skin glue on the incision, you may  shower in 24 hours.  The glue will flake off over the next 2-3 weeks.  Any sutures or staples will be removed at the office during your follow-up visit. °7. DRAINS:  If you have drains in place, it is important to keep a list of the amount of drainage produced each day in your drains.  Before leaving the hospital, you should be instructed on drain care.  Call our office if you have any questions about your drains. °8. ACTIVITIES:  You may resume regular (light) daily activities beginning the next day--such as daily self-care, walking, climbing stairs--gradually increasing activities as tolerated.  You may have sexual intercourse when it is comfortable.  Refrain from any heavy lifting or straining until approved by your doctor. °a. You may drive when you are no longer taking prescription pain medication, you can comfortably wear a seatbelt, and you can safely maneuver your car and apply brakes. °b. RETURN TO WORK:  __________________________________________________________ °9. You should see your doctor in the office for a follow-up appointment approximately 3-5 days after your surgery.  Your doctor’s nurse will typically make your follow-up appointment when she calls you with your pathology report.  Expect your pathology report 2-3 business days after your surgery.  You may call to check if you do not hear from us after three days.   °10. OTHER INSTRUCTIONS: ______________________________________________________________________________________________ ____________________________________________________________________________________________ °WHEN TO CALL YOUR DOCTOR: °1. Fever over 101.0 °2. Nausea and/or vomiting °3. Extreme swelling or bruising °4. Continued bleeding from incision. °5. Increased pain, redness, or drainage from the incision. °  The clinic staff is available to answer your questions during regular business hours.  Please don’t hesitate to call and ask to speak to one of the nurses for clinical  concerns.  If you have a medical emergency, go to the nearest emergency room or call 911.  A surgeon from [facility] is always on call at the hospital. °[address], [address], [place], [place]  [address] ? [address] [address], [place], [place]   [address] °[phone] ? [phone] ? FAX [phone] °Web site: [email or web address] °

## 2013-09-29 NOTE — Anesthesia Postprocedure Evaluation (Signed)
Anesthesia Post Note  Patient: Danielle Suarez  Procedure(s) Performed: Procedure(s) (LRB): RIGHT MASTECTOMY WITH RIGHT AXILLARY SENTINEL LYMPH NODE BIOPSY (Right) INSERTION PORT-A-CATH (Left)  Anesthesia type: general  Patient location: PACU  Post pain: Pain level controlled  Post assessment: Patient's Cardiovascular Status Stable  Last Vitals:  Filed Vitals:   09/29/13 1245  BP: 123/74  Pulse: 66  Temp: 36.8 C  Resp: 18    Post vital signs: Reviewed and stable  Level of consciousness: sedated  Complications: No apparent anesthesia complications

## 2013-09-29 NOTE — Transfer of Care (Signed)
Immediate Anesthesia Transfer of Care Note  Patient: Danielle Suarez  Procedure(s) Performed: Procedure(s): RIGHT MASTECTOMY WITH RIGHT AXILLARY SENTINEL LYMPH NODE BIOPSY (Right) INSERTION PORT-A-CATH (Left)  Patient Location: PACU  Anesthesia Type:General  Level of Consciousness: awake, alert  and oriented  Airway & Oxygen Therapy: Patient Spontanous Breathing and Patient connected to nasal cannula oxygen  Post-op Assessment: Report given to PACU RN and Post -op Vital signs reviewed and stable  Post vital signs: Reviewed and stable  Complications: No apparent anesthesia complications

## 2013-09-29 NOTE — Telephone Encounter (Signed)
Discussed surgery with sister.    Discussed post op care.

## 2013-09-29 NOTE — Anesthesia Preprocedure Evaluation (Addendum)
Anesthesia Evaluation  Patient identified by MRN, date of birth, ID band Patient awake    Reviewed: Allergy & Precautions, H&P , NPO status , Patient's Chart, lab work & pertinent test results  History of Anesthesia Complications (+) PONV and history of anesthetic complications  Airway Mallampati: I TM Distance: >3 FB Neck ROM: Full    Dental  (+) Teeth Intact, Dental Advisory Given   Pulmonary asthma ,          Cardiovascular     Neuro/Psych  Headaches, PSYCHIATRIC DISORDERS Anxiety    GI/Hepatic GERD-  Medicated and Controlled,  Endo/Other    Renal/GU      Musculoskeletal   Abdominal   Peds  Hematology   Anesthesia Other Findings Breast Cancer  Reproductive/Obstetrics                         Anesthesia Physical Anesthesia Plan  ASA: II  Anesthesia Plan: General   Post-op Pain Management:    Induction: Intravenous  Airway Management Planned: Oral ETT  Additional Equipment:   Intra-op Plan:   Post-operative Plan: Extubation in OR  Informed Consent: I have reviewed the patients History and Physical, chart, labs and discussed the procedure including the risks, benefits and alternatives for the proposed anesthesia with the patient or authorized representative who has indicated his/her understanding and acceptance.   Dental advisory given  Plan Discussed with: CRNA and Surgeon  Anesthesia Plan Comments:       Anesthesia Quick Evaluation

## 2013-09-29 NOTE — H&P (Signed)
Danielle Suarez is an 48 y.o. female.   Chief Complaint: right breast cancer HPI:  Pt presents with new breast cancer.  She has large mass.  She is prepared for surgery.  Past Medical History  Diagnosis Date  . Bruised ribs   . Abnormal Pap smear   . Condylomata acuminata   . LGSIL (low grade squamous intraepithelial dysplasia)   . HGSIL (high grade squamous intraepithelial dysplasia)   . Breast cancer   . Headache   . Hot flashes   . Complication of anesthesia     phenergan has helped in the past   . PONV (postoperative nausea and vomiting)   . Asthma     seasonal allergies, used inhaler only for bronchitis one- two  yrs. ago    . Anxiety   . GERD (gastroesophageal reflux disease)     acid reflux, lymphocytic colitis   . Arthritis     hips, knees    Past Surgical History  Procedure Laterality Date  . Dilation and curettage of uterus    . Colposcopy    . Cervical cone biopsy      negative     Family History  Problem Relation Age of Onset  . Diabetes Neg Hx   . Hypertension Neg Hx    Social History:  reports that she has never smoked. She has never used smokeless tobacco. She reports that she drinks alcohol. She reports that she does not use illicit drugs.  Allergies:  Allergies  Allergen Reactions  . Sulfonamide Derivatives Hives  . Hydrocodone Nausea And Vomiting  . Pollen Extract     Medications Prior to Admission  Medication Sig Dispense Refill  . ALPRAZolam (XANAX) 0.25 MG tablet Take 1 tablet (0.25 mg total) by mouth 2 (two) times daily as needed for anxiety.  60 tablet  1  . b complex vitamins tablet Take 1 tablet by mouth daily.      Marland Kitchen bismuth subsalicylate (PEPTO BISMOL) 262 MG/15ML suspension Take 30 mLs by mouth every 6 (six) hours as needed for indigestion or diarrhea or loose stools.      . calcium-vitamin D (OSCAL WITH D) 500-200 MG-UNIT per tablet Take 1 tablet by mouth.      . esomeprazole (NEXIUM) 20 MG capsule Take 20 mg by mouth daily as  needed (for acid reflux).       Marland Kitchen loratadine (CLARITIN) 10 MG tablet Take 10 mg by mouth daily.      . Multiple Vitamin (MULTIVITAMIN) capsule Take 1 capsule by mouth daily.      . Omega-3 Fatty Acids (FISH OIL) 1000 MG CAPS Take 1,000 mg by mouth daily.        Results for orders placed during the hospital encounter of 09/29/13 (from the past 48 hour(s))  URINALYSIS, ROUTINE W REFLEX MICROSCOPIC     Status: Abnormal   Collection Time    09/29/13  6:36 AM      Result Value Ref Range   Color, Urine YELLOW  YELLOW   APPearance HAZY (*) CLEAR   Specific Gravity, Urine 1.019  1.005 - 1.030   pH 7.0  5.0 - 8.0   Glucose, UA NEGATIVE  NEGATIVE mg/dL   Hgb urine dipstick NEGATIVE  NEGATIVE   Bilirubin Urine NEGATIVE  NEGATIVE   Ketones, ur NEGATIVE  NEGATIVE mg/dL   Protein, ur NEGATIVE  NEGATIVE mg/dL   Urobilinogen, UA 0.2  0.0 - 1.0 mg/dL   Nitrite NEGATIVE  NEGATIVE   Leukocytes, UA  NEGATIVE  NEGATIVE   Comment: MICROSCOPIC NOT DONE ON URINES WITH NEGATIVE PROTEIN, BLOOD, LEUKOCYTES, NITRITE, OR GLUCOSE <1000 mg/dL.   No results found.  Review of Systems  Constitutional: Negative.   HENT: Negative.   Eyes: Negative.   Respiratory: Negative.   Cardiovascular: Negative.   Gastrointestinal: Negative.   Genitourinary: Negative.   Musculoskeletal: Negative.   Skin: Negative.   Neurological: Negative.   Endo/Heme/Allergies: Negative.   Psychiatric/Behavioral: The patient is nervous/anxious.     Blood pressure 158/79, pulse 67, temperature 97.9 F (36.6 C), temperature source Oral, resp. rate 20, height 5\' 8"  (1.727 m), weight 202 lb (91.627 kg), last menstrual period 09/18/2013, SpO2 100.00%. Physical Exam  Constitutional: She is oriented to person, place, and time. She appears well-developed and well-nourished. Distressed: nervous.  HENT:  Head: Normocephalic and atraumatic.  Eyes: Pupils are equal, round, and reactive to light. No scleral icterus.  Neck: Normal range of  motion.  Cardiovascular: Normal rate and regular rhythm.   Respiratory: Effort normal. No respiratory distress.  GI: Soft. She exhibits no distension.  Musculoskeletal: She exhibits no edema.  Neurological: She is alert and oriented to person, place, and time.  Skin: Skin is warm and dry. No rash noted. She is not diaphoretic. No erythema. No pallor.  Psychiatric: She has a normal mood and affect. Her behavior is normal. Judgment and thought content normal.     Assessment/Plan Right T3N0 breast cancer Right mastectomy, SLN bx, port placement.  Risks, benefits reviewed.    Rourke Mcquitty 09/29/2013, 7:30 AM

## 2013-09-29 NOTE — Anesthesia Procedure Notes (Addendum)
Anesthesia Regional Block:  Pectoralis block  Pre-Anesthetic Checklist: ,, timeout performed, Correct Patient, Correct Site, Correct Laterality, Correct Procedure, Correct Position, site marked, Risks and benefits discussed,  Surgical consent,  Pre-op evaluation,  At surgeon's request and post-op pain management  Laterality: Right  Prep: chloraprep       Needles:  Injection technique: Single-shot  Needle Type: Echogenic Stimulator Needle     Needle Length: 9cm 9 cm Needle Gauge: 21 and 21 G    Additional Needles:  Procedures: ultrasound guided (picture in chart) Pectoralis block Narrative:  Start time: 09/29/2013 7:15 AM End time: 09/29/2013 7:30 AM Injection made incrementally with aspirations every 5 mL.  Performed by: Personally  Anesthesiologist: Lillia Abed MD  Additional Notes: Monitors applied. Patient sedated. Sterile prep and drape,hand hygiene and sterile gloves were used. Relevant anatomy identified.Needle position confirmed.Local anesthetic injected incrementally after negative aspiration. Vascular puncture avoided. No complications. Image printed for medical record.The patient tolerated the procedure well.       Procedure Name: Intubation Date/Time: 09/29/2013 7:52 AM Performed by: Storm Frisk E Pre-anesthesia Checklist: Patient identified, Timeout performed, Emergency Drugs available, Suction available and Patient being monitored Patient Re-evaluated:Patient Re-evaluated prior to inductionOxygen Delivery Method: Circle system utilized Preoxygenation: Pre-oxygenation with 100% oxygen Intubation Type: IV induction Ventilation: Mask ventilation without difficulty Laryngoscope Size: Mac and 3 Grade View: Grade I Tube type: Oral Tube size: 7.0 mm Number of attempts: 1 Airway Equipment and Method: Stylet Placement Confirmation: ETT inserted through vocal cords under direct vision,  breath sounds checked- equal and bilateral and positive ETCO2 Secured at: 23  cm Tube secured with: Tape Dental Injury: Teeth and Oropharynx as per pre-operative assessment

## 2013-09-30 DIAGNOSIS — C50919 Malignant neoplasm of unspecified site of unspecified female breast: Secondary | ICD-10-CM | POA: Diagnosis not present

## 2013-09-30 LAB — BASIC METABOLIC PANEL
Anion gap: 10 (ref 5–15)
Anion gap: 13 (ref 5–15)
BUN: 6 mg/dL (ref 6–23)
BUN: 6 mg/dL (ref 6–23)
CALCIUM: 8.7 mg/dL (ref 8.4–10.5)
CO2: 21 meq/L (ref 19–32)
CO2: 23 meq/L (ref 19–32)
Calcium: 7.7 mg/dL — ABNORMAL LOW (ref 8.4–10.5)
Chloride: 100 mEq/L (ref 96–112)
Chloride: 102 mEq/L (ref 96–112)
Creatinine, Ser: 0.63 mg/dL (ref 0.50–1.10)
Creatinine, Ser: 0.67 mg/dL (ref 0.50–1.10)
GFR calc Af Amer: >90 mL/min (ref >90)
GFR calc Af Amer: >90 mL/min (ref >90)
GFR calc non Af Amer: >90 mL/min (ref >90)
GLUCOSE: 116 mg/dL — AB (ref 70–99)
Glucose, Bld: 520 mg/dL — ABNORMAL HIGH (ref 70–99)
POTASSIUM: 6.3 meq/L — AB (ref 3.7–5.3)
Potassium: 4.2 mEq/L (ref 3.7–5.3)
SODIUM: 131 meq/L — AB (ref 137–147)
Sodium: 138 mEq/L (ref 137–147)

## 2013-09-30 LAB — CBC
HCT: 28.8 % — ABNORMAL LOW (ref 36.0–46.0)
HEMOGLOBIN: 9.6 g/dL — AB (ref 12.0–15.0)
MCH: 31.3 pg (ref 26.0–34.0)
MCHC: 33.3 g/dL (ref 30.0–36.0)
MCV: 93.8 fL (ref 78.0–100.0)
Platelets: 221 10*3/uL (ref 150–400)
RBC: 3.07 MIL/uL — AB (ref 3.87–5.11)
RDW: 12.8 % (ref 11.5–15.5)
WBC: 11.5 10*3/uL — ABNORMAL HIGH (ref 4.0–10.5)

## 2013-09-30 LAB — GLUCOSE, CAPILLARY: Glucose-Capillary: 139 mg/dL — ABNORMAL HIGH (ref 70–99)

## 2013-09-30 MED ORDER — OXYCODONE-ACETAMINOPHEN 5-325 MG PO TABS: 1.0000 | ORAL_TABLET | ORAL | Status: DC | PRN

## 2013-09-30 MED ORDER — CYCLOBENZAPRINE HCL 10 MG PO TABS: 10.0000 mg | ORAL_TABLET | Freq: Three times a day (TID) | ORAL | Status: DC | PRN

## 2013-09-30 NOTE — Discharge Summary (Signed)
Physician Discharge Summary  Patient ID: Danielle Suarez MRN: [id number] DOB/AGE: 1965/06/03 48 y.o.  Admit date: 09/29/2013 Discharge date: 09/30/2013  Admission Diagnoses: Right breast cancer  Discharge Diagnoses:  Active Problems:   Breast cancer ABL anemia  Discharged Condition: stable  Hospital Course:  Pt was admitted following right mastectomy, SLN bx, and port a cath placement.  She did well overnight other than soreness and some mild nausea.  She did well with percocet.  She is ambulating and voiding independently.  She does have some bloody drain output, but no flap hematoma.  She will be discharged to home in stable condition.    Consults: None  Significant Diagnostic Studies: labs: HCT 28 prior to d/c.    Treatments: surgery: see above.   Discharge Exam: Blood pressure 118/70, pulse 66, temperature 98.1 F (36.7 C), temperature source Oral, resp. rate 16, height 5\' 8"  (1.727 m), weight 202 lb (91.627 kg), last menstrual period 09/18/2013, SpO2 99.00%. General appearance: alert, cooperative and no distress Resp: breathing comfortably Chest wall: right sided chest wall tenderness, bloody drain output.    Disposition: 01-Home or Self Care     Medication List         ALPRAZolam 0.25 MG tablet  Commonly known as:  XANAX  Take 1 tablet (0.25 mg total) by mouth 2 (two) times daily as needed for anxiety.     b complex vitamins tablet  Take 1 tablet by mouth daily.     bismuth subsalicylate 292 KM/62MM suspension  Commonly known as:  PEPTO BISMOL  Take 30 mLs by mouth every 6 (six) hours as needed for indigestion or diarrhea or loose stools.     calcium-vitamin D 500-200 MG-UNIT per tablet  Commonly known as:  OSCAL WITH D  Take 1 tablet by mouth.     esomeprazole 20 MG capsule  Commonly known as:  NEXIUM  Take 20 mg by mouth daily as needed (for acid reflux).     Fish Oil 1000 MG Caps  Take 1,000 mg by mouth daily.     loratadine 10 MG tablet   Commonly known as:  CLARITIN  Take 10 mg by mouth daily.     multivitamin capsule  Take 1 capsule by mouth daily.     oxyCODONE-acetaminophen 5-325 MG per tablet  Commonly known as:  PERCOCET/ROXICET  Take 1-2 tablets by mouth every 4 (four) hours as needed for moderate pain.           Follow-up Information   Follow up with [facility], MD. Schedule an appointment as soon as possible for a visit in 2 weeks.   Specialty:  General Surgery   Contact information:   [address] [place] [address] [phone]       Signed: Stark Klein 09/30/2013, 8:20 AM

## 2013-09-30 NOTE — Progress Notes (Signed)
IV removed per order. Discharge instructions and prescriptions given and explained. Teachback done per patient. Dressing materials and cup to empty drains given. Taught patient how to empty drains, measure and document. Patient demonstrated good technique. Binder in place. PortaCath site without drainage. Discharged via wheelchair with NT present. Discharged to sister's care.

## 2013-09-30 NOTE — Progress Notes (Signed)
Glucose level in Bmet result this AM is 520 and potassium level-6.3. Cbg done at [facility] shows 139. Md paged and relayed results, ordered to redraw Bmet. Labs. Called for the said test.

## 2013-10-01 ENCOUNTER — Other Ambulatory Visit (INDEPENDENT_AMBULATORY_CARE_PROVIDER_SITE_OTHER): Payer: Self-pay | Admitting: General Surgery

## 2013-10-01 ENCOUNTER — Telehealth (INDEPENDENT_AMBULATORY_CARE_PROVIDER_SITE_OTHER): Payer: Self-pay | Admitting: General Surgery

## 2013-10-01 ENCOUNTER — Telehealth (INDEPENDENT_AMBULATORY_CARE_PROVIDER_SITE_OTHER): Payer: Self-pay

## 2013-10-01 ENCOUNTER — Encounter (HOSPITAL_COMMUNITY): Payer: Self-pay | Admitting: General Surgery

## 2013-10-01 DIAGNOSIS — Z9011 Acquired absence of right breast and nipple: Secondary | ICD-10-CM

## 2013-10-01 MED ORDER — HYDROMORPHONE HCL 2 MG PO TABS: 2.0000 mg | ORAL_TABLET | ORAL | Status: DC | PRN

## 2013-10-01 NOTE — Telephone Encounter (Signed)
Pt s/p right breast mastectomy. Pt is calling to get advise on pain discomfort. Advised pt that she can take pain meds every 4 hours as needed for pain, she can also take Ibuprofen up to 800mg  every 8 hours as needed. Advised pt that she can also place ice on the area. Pt states that she has been placing ice on the breast. Pt states that the drain area has been a little tender. Pt states that the drain has been draining as it should. Pt denies any fevers, chills, redness or swelling at this time.  Pt also needs a f/u appt to come back to see Dr Barry Dienes, will have Jearld Fenton look at Dr Marlowe Aschoff schedule and set this appt up for the pt. Pt verbalized understanding and agrees with POC.

## 2013-10-01 NOTE — Telephone Encounter (Signed)
Pt with 5/6 positive nodes.   Discussed that we would need to do ALND.   We will get this scheduled.    Port already in for chemo.

## 2013-10-02 ENCOUNTER — Telehealth: Payer: Self-pay | Admitting: *Deleted

## 2013-10-02 NOTE — Telephone Encounter (Signed)
Spoke with patient today.  She is quite upset that she has to have more surgery. Offered support and encouragement.  Patient states she has an appointment with Dr. Georgiann Cocker in [place] and needs to cancel her appointment here. I will do that. Informed her that she could contact anytime with any needs or concerns. She also requests contact information for counseling and I gave her that information.

## 2013-10-03 ENCOUNTER — Other Ambulatory Visit (INDEPENDENT_AMBULATORY_CARE_PROVIDER_SITE_OTHER): Payer: Self-pay | Admitting: General Surgery

## 2013-10-09 ENCOUNTER — Ambulatory Visit (INDEPENDENT_AMBULATORY_CARE_PROVIDER_SITE_OTHER): Payer: BC Managed Care – PPO | Admitting: General Surgery

## 2013-10-09 VITALS — BP 124/76 | HR 85 | Ht 68.0 in | Wt 205.2 lb

## 2013-10-09 DIAGNOSIS — C50411 Malignant neoplasm of upper-outer quadrant of right female breast: Secondary | ICD-10-CM

## 2013-10-09 DIAGNOSIS — C50419 Malignant neoplasm of upper-outer quadrant of unspecified female breast: Secondary | ICD-10-CM

## 2013-10-11 NOTE — Progress Notes (Signed)
HISTORY: Pt is approximately 1-2 weeks following right mastectomy and sentinel lymph node biopsy.  She had 5/6 LN positive. She was having quite a bit of pain up front, but was not taking her pain meds.  She is doing better now that she is taking some narcotics at night.  She still has some soreness and swelling in the axilla.  She denies fever/chills.  Her drains have been putting out around 30 mL/day x 3 days.    EXAM: General:  Alert and oriented Incision:  No evidence of infection.  Some swelling in axilla.    PATHOLOGY: Diagnosis 1. Breast, radical mastectomy (including lymph nodes), right - INVASIVE DUCTO-LOBULAR CARCINOMA, SEE COMMENT. - POSITIVE FOR LYMPH VASCULAR INVASION. - INVASIVE TUMOR IS 1.2 CM FROM NEAREST MARGIN (POSTERIOR). 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 - SKIN AND NIPPLE DERMIS, POSITIVE FOR INVASIVE TUMOR. - POSITIVE FOR DERMAL LYMPH VASCULAR INVASION (NIPPLE). - SEE TUMOR SYNOPTIC TEMPLATE BELOW. 2. Lymph node, sentinel, biopsy, right axillary #1 - ONE LYMPH NODE, POSITIVE FOR METASTATIC MAMMARY CARCINOMA (1/1). - TUMOR DEPOSIT IS 1.4 CM. 1 of 4 FINAL for Berkey, Ryleeann L [phone]) Diagnosis(continued) - POSITIVE FOR EXTRACAPSULAR TUMOR EXTENSION. 3. Lymph node, sentinel, biopsy, right axillary #2 - ONE LYMPH NODE, NEGATIVE FOR TUMOR (0/1). - SEE COMMENT. 4. Lymph node, sentinel, biopsy, right axillary #3 - ONE LYMPH NODE, POSITIVE FOR METASTATIC MAMMARY CARCINOMA (1/1). - TUMOR DEPOSIT IS 0.8 CM. - POSITIVE FOR EXTRACAPSULAR TUMOR EXTENSION. 5. Lymph node, sentinel, biopsy, right axillary #4 - ONE LYMPH NODE, POSITIVE FOR METASTATIC MAMMARY CARCINOMA (1/1). - TUMOR DEPOSIT IS 8 MM. - POSITIVE FOR EXTRACAPSULAR TUMOR EXTENSION. 6. Lymph node, sentinel, biopsy, right axillary #5 - ONE LYMPH NODE, POSITIVE FOR METASTATIC MAMMARY CARCINOMA (1/1). - TUMOR DEPOSIT IS 7 MM. - POSITIVE FOR  EXTRACAPSULAR TUMOR EXTENSION. 7. Lymph node, sentinel, biopsy, right axillary #6 - ONE LYMPH NODE, NEGATIVE FOR TUMOR (0/1) SEE COMMENT.   ASSESSMENT AND PLAN:   Breast cancer of upper-outer quadrant of right female breast Given the 5/6 LN positive, we will go back and do completion ALND. She does have a small hematoma in the axilla.  I will evacuate this and trim some additional skin in the axilla at time of surgery.    We reviewed the surgery, risks, and benefits.  I advised her that she would stay overnight again.  i discussed risks of bleeding, infection, nerve damage, weakness in the shoulder, and other possible complications.   Once she is all healed, she will start chemo and then proceed to radiation.        Milus Height, MD Surgical Oncology, [facility]  Eldred Manges, MD Leo Grosser, Seymour Bars, MD  Bertis Ruddy

## 2013-10-11 NOTE — Assessment & Plan Note (Signed)
Given the 5/6 LN positive, we will go back and do completion ALND. She does have a small hematoma in the axilla.  I will evacuate this and trim some additional skin in the axilla at time of surgery.    We reviewed the surgery, risks, and benefits.  I advised her that she would stay overnight again.  i discussed risks of bleeding, infection, nerve damage, weakness in the shoulder, and other possible complications.   Once she is all healed, she will start chemo and then proceed to radiation.

## 2013-10-12 ENCOUNTER — Telehealth (INDEPENDENT_AMBULATORY_CARE_PROVIDER_SITE_OTHER): Payer: Self-pay | Admitting: *Deleted

## 2013-10-12 ENCOUNTER — Ambulatory Visit (INDEPENDENT_AMBULATORY_CARE_PROVIDER_SITE_OTHER): Payer: BC Managed Care – PPO | Admitting: General Surgery

## 2013-10-12 ENCOUNTER — Encounter (INDEPENDENT_AMBULATORY_CARE_PROVIDER_SITE_OTHER): Payer: Self-pay | Admitting: General Surgery

## 2013-10-12 VITALS — BP 128/70 | HR 80 | Temp 98.1°F | Resp 16

## 2013-10-12 DIAGNOSIS — C50411 Malignant neoplasm of upper-outer quadrant of right female breast: Secondary | ICD-10-CM

## 2013-10-12 DIAGNOSIS — C50419 Malignant neoplasm of upper-outer quadrant of unspecified female breast: Secondary | ICD-10-CM

## 2013-10-12 NOTE — Telephone Encounter (Signed)
Pt called into Triage stating that she saw Dr. Barry Dienes 10-09-13 and had a drain removed.  Pt states that it has gotten infected with yellow pus coming out of the wound.  Pt also states that where her Port A Cath was placed, that it looked infected as well with fluid backed up.  Pt advised that Dr. Barry Dienes advised her to keep a close look on the wounds.  Pt is scheduled for surgery 10-20-13, Right Axilla Lymph Node Dissection and was concerned that this would prolong the surgery.  I went to Jeralyn Ruths, CMA, Dr. Marlowe Aschoff assistant and they are going to bring the pt in today to check the incisions out.  Pt advised to arrive at 3:30.  Pt verbalized understanding.  Anderson [place]

## 2013-10-12 NOTE — Progress Notes (Signed)
HISTORY: Pt is s/p right mastectomy with SLN bx pos 5/6 nodes.  Her drains were pulled Friday, and she is having swelling and drainage at her drain sites.  She denies fever/chills.  The volume of drainage is minimal.  .    EXAM: General:  Alert and oriented Incision:  No evidence of infection.  Some swelling in axilla.    PATHOLOGY: Diagnosis 1. Breast, radical mastectomy (including lymph nodes), right - INVASIVE DUCTO-LOBULAR CARCINOMA, SEE COMMENT. - POSITIVE FOR LYMPH VASCULAR INVASION. - INVASIVE TUMOR IS 1.2 CM FROM NEAREST MARGIN (POSTERIOR). 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 - SKIN AND NIPPLE DERMIS, POSITIVE FOR INVASIVE TUMOR. - POSITIVE FOR DERMAL LYMPH VASCULAR INVASION (NIPPLE). - SEE TUMOR SYNOPTIC TEMPLATE BELOW. 2. Lymph node, sentinel, biopsy, right axillary #1 - ONE LYMPH NODE, POSITIVE FOR METASTATIC MAMMARY CARCINOMA (1/1). - TUMOR DEPOSIT IS 1.4 CM. 1 of 4 FINAL for Emley, Waverley L [phone]) Diagnosis(continued) - POSITIVE FOR EXTRACAPSULAR TUMOR EXTENSION. 3. Lymph node, sentinel, biopsy, right axillary #2 - ONE LYMPH NODE, NEGATIVE FOR TUMOR (0/1). - SEE COMMENT. 4. Lymph node, sentinel, biopsy, right axillary #3 - ONE LYMPH NODE, POSITIVE FOR METASTATIC MAMMARY CARCINOMA (1/1). - TUMOR DEPOSIT IS 0.8 CM. - POSITIVE FOR EXTRACAPSULAR TUMOR EXTENSION. 5. Lymph node, sentinel, biopsy, right axillary #4 - ONE LYMPH NODE, POSITIVE FOR METASTATIC MAMMARY CARCINOMA (1/1). - TUMOR DEPOSIT IS 8 MM. - POSITIVE FOR EXTRACAPSULAR TUMOR EXTENSION. 6. Lymph node, sentinel, biopsy, right axillary #5 - ONE LYMPH NODE, POSITIVE FOR METASTATIC MAMMARY CARCINOMA (1/1). - TUMOR DEPOSIT IS 7 MM. - POSITIVE FOR EXTRACAPSULAR TUMOR EXTENSION. 7. Lymph node, sentinel, biopsy, right axillary #6 - ONE LYMPH NODE, NEGATIVE FOR TUMOR (0/1) SEE COMMENT.   ASSESSMENT AND PLAN:   Breast cancer of upper-outer  quadrant of right female breast Small seroma aspirated (35 mL) No evidence of infection.  OK to shower.  Follow up next week unless seroma recurs and is uncomfortable.   I will see her in 1 week for completion ALND.       Milus Height, MD Surgical Oncology, [facility]  Eldred Manges, MD No ref. provider found  Bertis Ruddy

## 2013-10-12 NOTE — Assessment & Plan Note (Addendum)
Small seroma aspirated (35 mL) No evidence of infection.  OK to shower.  Follow up next week unless seroma recurs and is uncomfortable.   I will see her in 1 week for completion ALND.

## 2013-10-19 ENCOUNTER — Ambulatory Visit (INDEPENDENT_AMBULATORY_CARE_PROVIDER_SITE_OTHER): Payer: BC Managed Care – PPO | Admitting: General Surgery

## 2013-10-19 VITALS — BP 132/72 | HR 84 | Temp 98.1°F

## 2013-10-19 DIAGNOSIS — C50411 Malignant neoplasm of upper-outer quadrant of right female breast: Secondary | ICD-10-CM

## 2013-10-19 DIAGNOSIS — C50419 Malignant neoplasm of upper-outer quadrant of unspecified female breast: Secondary | ICD-10-CM

## 2013-10-19 NOTE — Progress Notes (Signed)
HISTORY: Pt is around 2 weeks s/p right mastectomy with sentinel lymph node biopsy with 5/6 LN positive.  We were planning a ALND, but her oncologist feels strongly that we should defer and get chemo started pronto.  The patient noted that she was developing pressure and a sloshy sensation.  She denies fever/chills.      EXAM: General:  Alert and oriented. Incision:  Seroma present on right.     PATHOLOGY: Diagnosis 1. Breast, radical mastectomy (including lymph nodes), right - INVASIVE DUCTO-LOBULAR CARCINOMA, SEE COMMENT. - POSITIVE FOR LYMPH VASCULAR INVASION. - INVASIVE TUMOR IS 1.2 CM FROM NEAREST MARGIN (POSTERIOR). 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 - SKIN AND NIPPLE DERMIS, POSITIVE FOR INVASIVE TUMOR. - POSITIVE FOR DERMAL LYMPH VASCULAR INVASION (NIPPLE). - SEE TUMOR SYNOPTIC TEMPLATE BELOW. 2. Lymph node, sentinel, biopsy, right axillary #1 - ONE LYMPH NODE, POSITIVE FOR METASTATIC MAMMARY CARCINOMA (1/1). - TUMOR DEPOSIT IS 1.4 CM. 1 of 4 FINAL for Bendall, Mata L [phone]) Diagnosis(continued) - POSITIVE FOR EXTRACAPSULAR TUMOR EXTENSION. 3. Lymph node, sentinel, biopsy, right axillary #2 - ONE LYMPH NODE, NEGATIVE FOR TUMOR (0/1). - SEE COMMENT. 4. Lymph node, sentinel, biopsy, right axillary #3 - ONE LYMPH NODE, POSITIVE FOR METASTATIC MAMMARY CARCINOMA (1/1). - TUMOR DEPOSIT IS 0.8 CM. - POSITIVE FOR EXTRACAPSULAR TUMOR EXTENSION. 5. Lymph node, sentinel, biopsy, right axillary #4 - ONE LYMPH NODE, POSITIVE FOR METASTATIC MAMMARY CARCINOMA (1/1). - TUMOR DEPOSIT IS 8 MM. - POSITIVE FOR EXTRACAPSULAR TUMOR EXTENSION. 6. Lymph node, sentinel, biopsy, right axillary #5 - ONE LYMPH NODE, POSITIVE FOR METASTATIC MAMMARY CARCINOMA (1/1). - TUMOR DEPOSIT IS 7 MM. - POSITIVE FOR EXTRACAPSULAR TUMOR EXTENSION. 7. Lymph node, sentinel, biopsy, right axillary #6 - ONE LYMPH NODE, NEGATIVE FOR TUMOR (0/1)  SEE COMMENT.   ASSESSMENT AND PLAN:   Breast cancer of upper-outer quadrant of right female breast Seroma aspirated for 90 mL.    Follow up next week.  She is meeting with XRT this week and physical therapy.        Milus Height, MD Surgical Oncology, [facility]  Eldred Manges, MD Leo Grosser, Seymour Bars, MD

## 2013-10-19 NOTE — Assessment & Plan Note (Signed)
Seroma aspirated for 90 mL.    Follow up next week.  She is meeting with XRT this week and physical therapy.

## 2013-10-20 ENCOUNTER — Encounter (HOSPITAL_BASED_OUTPATIENT_CLINIC_OR_DEPARTMENT_OTHER): Admission: RE | Payer: Self-pay | Source: Ambulatory Visit

## 2013-10-20 ENCOUNTER — Ambulatory Visit (HOSPITAL_BASED_OUTPATIENT_CLINIC_OR_DEPARTMENT_OTHER): Admission: RE | Admit: 2013-10-20 | Payer: BC Managed Care – PPO | Source: Ambulatory Visit | Admitting: General Surgery

## 2013-10-20 SURGERY — LYMPHADENECTOMY, AXILLARY
Anesthesia: General | Laterality: Right

## 2013-10-21 ENCOUNTER — Ambulatory Visit: Payer: BC Managed Care – PPO | Admitting: Hematology and Oncology

## 2013-10-22 ENCOUNTER — Telehealth: Payer: Self-pay | Admitting: *Deleted

## 2013-10-22 NOTE — Telephone Encounter (Signed)
Error

## 2013-10-29 ENCOUNTER — Telehealth: Payer: Self-pay | Admitting: *Deleted

## 2013-10-29 ENCOUNTER — Encounter (INDEPENDENT_AMBULATORY_CARE_PROVIDER_SITE_OTHER): Payer: BC Managed Care – PPO | Admitting: General Surgery

## 2013-10-29 NOTE — Telephone Encounter (Signed)
Received refill request for Alprazolam 0.25mg  from [employer] [facility].  Nurse noted that pt has no follow up appts.  Spoke with Varney Biles, nurse navigator, and was informed that pt is being seen by Dr. Georgiann Cocker in [place].   Refill denied and faxed back to [facility].

## 2013-10-29 NOTE — Telephone Encounter (Signed)
THU, RN from Med/Onc called asking if patient to start XRT this next week, nothing in Epic told to her, Dr.Wentworth saw her in breast clinic back in June 24,2015 4:29 PM

## 2013-10-30 ENCOUNTER — Encounter (INDEPENDENT_AMBULATORY_CARE_PROVIDER_SITE_OTHER): Payer: BC Managed Care – PPO | Admitting: General Surgery

## 2013-10-30 NOTE — Telephone Encounter (Signed)
She should be getting chemotherapy first, was my impression and she was looking at that in [place].   Varney Biles- Can you follow up with the patient and see what is going on? She has a follow up with me this week just a normal follow up but I thought she needed chemo first.  SW

## 2013-12-08 ENCOUNTER — Encounter: Payer: Self-pay | Admitting: Emergency Medicine

## 2013-12-08 ENCOUNTER — Emergency Department
Admission: EM | Admit: 2013-12-08 | Discharge: 2013-12-08 | Disposition: A | Discharge status: Home / Self Care | Payer: BC Managed Care – PPO | Source: Home / Self Care | Attending: Emergency Medicine | Admitting: Emergency Medicine

## 2013-12-08 DIAGNOSIS — L237 Allergic contact dermatitis due to plants, except food: Secondary | ICD-10-CM

## 2013-12-08 MED ORDER — HALOBETASOL PROPIONATE 0.05 % EX CREA: TOPICAL_CREAM | Freq: Two times a day (BID) | CUTANEOUS | Status: DC

## 2013-12-08 MED ORDER — PREDNISONE 20 MG PO TABS: 20.0000 mg | ORAL_TABLET | Freq: Two times a day (BID) | ORAL | Status: DC

## 2013-12-08 MED ORDER — METHYLPREDNISOLONE SODIUM SUCC 125 MG IJ SOLR
125.0000 mg | INTRAMUSCULAR | Status: AC
  Administered 2013-12-08: 125 mg via INTRAMUSCULAR

## 2013-12-08 NOTE — ED Notes (Signed)
Pt c/o itching rash on her RT arm, abd and leg x 10 days.

## 2013-12-08 NOTE — Discharge Instructions (Signed)
For severe poison ivy, Solu-Medrol 125 mg intramuscular cortisone shot today.  Prescription sent to your pharmacy: Ultravate cream, apply to affected areas twice a day Prednisone 20 mg by mouth twice a day x5 days  Please show this instruction sheet to your oncologist

## 2013-12-08 NOTE — ED Provider Notes (Signed)
CSN: [id number]     Arrival date & time 12/08/13  0810 History   First MD Initiated Contact with Patient 12/08/13 0815     Chief Complaint  Patient presents with  . Rash   (Consider location/radiation/quality/duration/timing/severity/associated sxs/prior Treatment) HPI Pt c/o itching rash on her RT arm, abd and leg x 10 days.  Exposed to poison ivy Past Medical History  Diagnosis Date  . Bruised ribs   . Abnormal Pap smear   . Condylomata acuminata   . LGSIL (low grade squamous intraepithelial dysplasia)   . HGSIL (high grade squamous intraepithelial dysplasia)   . Breast cancer   . Headache   . Hot flashes   . Complication of anesthesia     phenergan has helped in the past   . PONV (postoperative nausea and vomiting)   . Asthma     seasonal allergies, used inhaler only for bronchitis one- two  yrs. ago    . Anxiety   . GERD (gastroesophageal reflux disease)     acid reflux, lymphocytic colitis   . Arthritis     hips, knees   Past Surgical History  Procedure Laterality Date  . Dilation and curettage of uterus    . Colposcopy    . Cervical cone biopsy      negative   . Mastectomy complete / simple w/ sentinel node biopsy Right 09/29/2013  . Portacath placement  09/29/2013  . Mastectomy w/ sentinel node biopsy Right 09/29/2013    Procedure: RIGHT MASTECTOMY WITH RIGHT AXILLARY SENTINEL LYMPH NODE BIOPSY;  Surgeon: Stark Klein, MD;  Location: [facility];  Service: General;  Laterality: Right;  . Portacath placement Left 09/29/2013    Procedure: INSERTION PORT-A-CATH;  Surgeon: Stark Klein, MD;  Location: [facility];  Service: General;  Laterality: Left;   Family History  Problem Relation Age of Onset  . Diabetes Neg Hx   . Hypertension Neg Hx    History  Substance Use Topics  . Smoking status: Never Smoker   . Smokeless tobacco: Never Used  . Alcohol Use: Yes     Comment: 2 glasses of wine    OB History   Grav Para Term Preterm Abortions TAB SAB Ect Mult Living   4 2              Review of Systems  All other systems reviewed and are negative.   Allergies  Sulfonamide derivatives; Hydrocodone; and Pollen extract  Home Medications   Prior to Admission medications   Medication Sig Start Date End Date Taking? Authorizing Provider  ALPRAZolam (XANAX) 0.25 MG tablet Take 1 tablet (0.25 mg total) by mouth 2 (two) times daily as needed for anxiety. 08/19/13   Wilmon Arms, MD  b complex vitamins tablet Take 1 tablet by mouth daily.    Historical Provider, MD  bismuth subsalicylate (PEPTO BISMOL) 262 MG/15ML suspension Take 30 mLs by mouth every 6 (six) hours as needed for indigestion or diarrhea or loose stools.    Historical Provider, MD  calcium-vitamin D (OSCAL WITH D) 500-200 MG-UNIT per tablet Take 1 tablet by mouth.    Historical Provider, MD  cyclobenzaprine (FLEXERIL) 10 MG tablet Take 1 tablet (10 mg total) by mouth 3 (three) times daily as needed for muscle spasms. 09/30/13   Stark Klein, MD  esomeprazole (NEXIUM) 20 MG capsule Take 20 mg by mouth daily as needed (for acid reflux).     Historical Provider, MD  halobetasol (ULTRAVATE) 0.05 % cream Apply topically 2 (two) times  daily. 12/08/13   Jacqulyn Cane, MD  HYDROmorphone (DILAUDID) 2 MG tablet Take 1 tablet (2 mg total) by mouth every 4 (four) hours as needed for severe pain. 10/01/13   Stark Klein, MD  loratadine (CLARITIN) 10 MG tablet Take 10 mg by mouth daily.    Historical Provider, MD  Multiple Vitamin (MULTIVITAMIN) capsule Take 1 capsule by mouth daily.    Historical Provider, MD  Omega-3 Fatty Acids (FISH OIL) 1000 MG CAPS Take 1,000 mg by mouth daily.    Historical Provider, MD  oxyCODONE-acetaminophen (PERCOCET/ROXICET) 5-325 MG per tablet Take 1-2 tablets by mouth every 4 (four) hours as needed for moderate pain. 09/30/13   Stark Klein, MD  predniSONE (DELTASONE) 20 MG tablet Take 1 tablet (20 mg total) by mouth 2 (two) times daily with a meal. X 5 days 12/08/13   Jacqulyn Cane, MD   BP 142/100   Pulse 85  Temp(Src) 98.1 F (36.7 C) (Oral)  Resp 16  Ht 5' 8.5" (1.74 m)  Wt 208 lb (94.348 kg)  BMI 31.16 kg/m2  SpO2 100% Physical Exam  Nursing note and vitals reviewed. Constitutional: She is oriented to person, place, and time. She appears well-developed and well-nourished. No distress.  HENT:  Head: Normocephalic and atraumatic.  Eyes: Conjunctivae and EOM are normal. Pupils are equal, round, and reactive to light. No scleral icterus.  Neck: Normal range of motion.  Cardiovascular: Normal rate.   Pulmonary/Chest: Effort normal.  Abdominal: She exhibits no distension.  Musculoskeletal: Normal range of motion.  Neurological: She is alert and oriented to person, place, and time.  Skin: Skin is warm. Rash noted.  Severe poison ivy rash right arm, axilla, trunk, abdomen and upper leg, a few areas on neck and face  Psychiatric: She has a normal mood and affect.    ED Course  Procedures (including critical care time) Labs Review Labs Reviewed - No data to display  Imaging Review No results found.   MDM   1. Poison ivy dermatitis    Treatment options discussed, as well as risks, benefits, alternatives. Patient voiced understanding and agreement with the following plans: Solu-Medrol 125 mg IM stat Prednisone 20 mg twice a day x5 days Ultravate cream Oral Benadryl when necessary itch Other symptomatic care discussed An After Visit Summary was printed and given to the patient. Advised her to bring copy of AVS to her oncologist, will be having chemotherapy later this week    Jacqulyn Cane, MD 12/08/13 [phone]

## 2013-12-28 ENCOUNTER — Encounter: Payer: Self-pay | Admitting: Emergency Medicine

## 2014-08-20 ENCOUNTER — Other Ambulatory Visit: Payer: Self-pay | Admitting: Obstetrics and Gynecology

## 2014-08-20 DIAGNOSIS — R52 Pain, unspecified: Secondary | ICD-10-CM

## 2014-08-25 ENCOUNTER — Ambulatory Visit
Admission: RE | Admit: 2014-08-25 | Discharge: 2014-08-25 | Disposition: A | Discharge status: Home / Self Care | Payer: BLUE CROSS/BLUE SHIELD | Source: Ambulatory Visit | Attending: Obstetrics and Gynecology | Admitting: Obstetrics and Gynecology

## 2014-08-25 ENCOUNTER — Other Ambulatory Visit: Payer: Self-pay | Admitting: Obstetrics and Gynecology

## 2014-08-25 DIAGNOSIS — R52 Pain, unspecified: Secondary | ICD-10-CM

## 2015-02-17 ENCOUNTER — Emergency Department
Admission: EM | Admit: 2015-02-17 | Discharge: 2015-02-17 | Disposition: A | Discharge status: Home / Self Care | Payer: BLUE CROSS/BLUE SHIELD | Source: Home / Self Care | Attending: Family Medicine | Admitting: Family Medicine

## 2015-02-17 ENCOUNTER — Encounter: Payer: Self-pay | Admitting: *Deleted

## 2015-02-17 DIAGNOSIS — J019 Acute sinusitis, unspecified: Secondary | ICD-10-CM

## 2015-02-17 DIAGNOSIS — J069 Acute upper respiratory infection, unspecified: Secondary | ICD-10-CM

## 2015-02-17 MED ORDER — PSEUDOEPHEDRINE HCL 60 MG PO TABS: 60.0000 mg | ORAL_TABLET | Freq: Four times a day (QID) | ORAL | Status: DC | PRN

## 2015-02-17 MED ORDER — BENZONATATE 100 MG PO CAPS: 100.0000 mg | ORAL_CAPSULE | Freq: Three times a day (TID) | ORAL | Status: DC

## 2015-02-17 MED ORDER — AMOXICILLIN-POT CLAVULANATE 875-125 MG PO TABS: 1.0000 | ORAL_TABLET | Freq: Two times a day (BID) | ORAL | Status: DC

## 2015-02-17 NOTE — Discharge Instructions (Signed)
You may take 400-600mg  Ibuprofen (Motrin) every 6-8 hours for fever and pain  Alternate with Tylenol  You may take 500mg  Tylenol every 4-6 hours as needed for fever and pain  If you are taking a combination over the counter cold medication such as Dayquil or Mucinex, please be sure to read the labels at they likely already contain acetaminophen.  Do not take additional acetaminophen (Tylenol) at the same time. Cool Mist Vaporizers Vaporizers may help relieve the symptoms of a cough and cold. They add moisture to the air, which helps mucus to become thinner and less sticky. This makes it easier to breathe and cough up secretions. Cool mist vaporizers do not cause serious burns like hot mist vaporizers, which may also be called steamers or humidifiers. Vaporizers have not been proven to help with colds. You should not use a vaporizer if you are allergic to mold. HOME CARE INSTRUCTIONS  Follow the package instructions for the vaporizer.  Do not use anything other than distilled water in the vaporizer.  Do not run the vaporizer all of the time. This can cause mold or bacteria to grow in the vaporizer.  Clean the vaporizer after each time it is used.  Clean and dry the vaporizer well before storing it.  Stop using the vaporizer if worsening respiratory symptoms develop.   This information is not intended to replace advice given to you by your health care provider. Make sure you discuss any questions you have with your health care provider.   Document Released: 11/10/2003 Document Revised: 02/17/2013 Document Reviewed: 07/02/2012 Elsevier Interactive Patient Education 2016 Elsevier [employer].  Sinus Rinse WHAT IS A SINUS RINSE? A sinus rinse is a home treatment. It rinses your sinuses with a mixture of salt and water (saline solution). Sinuses are air-filled spaces in your skull behind the bones of your face and forehead. They open into your nasal cavity. To do a sinus rinse, you will  need:  Saline solution.  Neti pot or spray bottle. This releases the saline solution into your nose and through your sinuses. You can buy neti pots and spray bottles at:  Your local pharmacy.  A health food store.  Online. WHEN WOULD I DO A SINUS RINSE?  A sinus rinse can help to clear your nasal cavity. It can clear:   Mucus.  Dirt.  Dust.  Pollen. You may do a sinus rinse when you have:  A cold.  A virus.  Allergies.  A sinus infection.  A stuffy nose. If you are considering a sinus rinse:  Ask your child's doctor before doing a sinus rinse on your child.  Do not do a sinus rinse if you have had:  Ear or nasal surgery.  An ear infection.  Blocked ears. HOW DO I DO A SINUS RINSE?   Wash your hands.  Disinfect your device using the directions that came with the device.  Dry your device.  Use the solution that comes with your device or one that is sold separately in stores. Follow the mixing directions on the package.  Fill your device with the amount of saline solution as stated in the device instructions.  Stand over a sink and tilt your head sideways over the sink.  Place the spout of the device in your upper nostril (the one closer to the ceiling).  Gently pour or squeeze the saline solution into the nasal cavity. The liquid should drain to the lower nostril if you are not too congested.  Gently blow  your nose. Blowing too hard may cause ear pain.  Repeat in the other nostril.  Clean and rinse your device with clean water.  Air-dry your device. ARE THERE RISKS OF A SINUS RINSE?  Sinus rinse is normally very safe and helpful. However, there are a few risks, which include:   A burning feeling in the sinuses. This may happen if you do not make the saline solution as instructed. Make sure to follow all directions when making the saline solution.  Infection from unclean water. This is rare, but possible.  Nasal irritation.   This information  is not intended to replace advice given to you by your health care provider. Make sure you discuss any questions you have with your health care provider.   Document Released: 09/09/2013 Document Reviewed: 09/09/2013 Elsevier Interactive Patient Education 2016 [place] with your primary care provider next week for recheck of symptoms if not improving.  Be sure to drink plenty of fluids and rest, at least 8hrs of sleep a night, preferably more while you are sick. Return urgent care or go to closest ER if you cannot keep down fluids/signs of dehydration, fever not reducing with Tylenol, difficulty breathing/wheezing, stiff neck, worsening condition, or other concerns (see below)  Please take antibiotics as prescribed and be sure to complete entire course even if you start to feel better to ensure infection does not come back.

## 2015-02-17 NOTE — ED Provider Notes (Signed)
CSN: [id number]     Arrival date & time 02/17/15  [id number] History   First MD Initiated Contact with Patient 02/17/15 [phone]     Chief Complaint  Patient presents with  . Cough  . Nasal Congestion   (Consider location/radiation/quality/duration/timing/severity/associated sxs/prior Treatment) HPI Pt is a 49yo female presenting to [facility] with c/o 3 week long hx of worsening cough, congestion, sinus congestion with frontal headache and now Left sided ear pain and "fullness." she has been coughing up green mucous and having a large amount of yellow and green nasal drainage.  She has been try OTC cough and cold medication with only minimal relief.  She also has nausea but denies vomiting or diarrhea. Denies chest pain or SOB.  No known sick contacts or recent travel.   Past Medical History  Diagnosis Date  . Bruised ribs   . Abnormal Pap smear   . Condylomata acuminata   . LGSIL (low grade squamous intraepithelial dysplasia)   . HGSIL (high grade squamous intraepithelial dysplasia)   . Breast cancer ([place])   . Headache   . Hot flashes   . Complication of anesthesia     phenergan has helped in the past   . PONV (postoperative nausea and vomiting)   . Asthma     seasonal allergies, used inhaler only for bronchitis one- two  yrs. ago    . Anxiety   . GERD (gastroesophageal reflux disease)     acid reflux, lymphocytic colitis   . Arthritis     hips, knees   Past Surgical History  Procedure Laterality Date  . Dilation and curettage of uterus    . Colposcopy    . Cervical cone biopsy      negative   . Mastectomy complete / simple w/ sentinel node biopsy Right 09/29/2013  . Portacath placement  09/29/2013  . Mastectomy w/ sentinel node biopsy Right 09/29/2013    Procedure: RIGHT MASTECTOMY WITH RIGHT AXILLARY SENTINEL LYMPH NODE BIOPSY;  Surgeon: Stark Klein, MD;  Location: [place];  Service: General;  Laterality: Right;  . Portacath placement Left 09/29/2013    Procedure: INSERTION PORT-A-CATH;   Surgeon: Stark Klein, MD;  Location: [place];  Service: General;  Laterality: Left;   Family History  Problem Relation Age of Onset  . Diabetes Neg Hx   . Hypertension Neg Hx    Social History  Substance Use Topics  . Smoking status: Never Smoker   . Smokeless tobacco: Never Used  . Alcohol Use: Yes     Comment: 2 glasses of wine    OB History    Gravida Para Term Preterm AB TAB SAB Ectopic Multiple Living   4 2             Review of Systems  Constitutional: Positive for fever and chills.  HENT: Positive for congestion, ear pain (Left), rhinorrhea, sinus pressure, sore throat and voice change. Negative for trouble swallowing.   Eyes: Positive for discharge. Negative for photophobia, pain and redness.  Respiratory: Positive for cough and wheezing. Negative for shortness of breath.   Cardiovascular: Negative for chest pain and palpitations.  Gastrointestinal: Positive for nausea. Negative for vomiting, abdominal pain and diarrhea.  Musculoskeletal: Positive for myalgias and arthralgias. Negative for back pain.  Skin: Negative for rash.  Neurological: Positive for headaches. Negative for dizziness and light-headedness.  All other systems reviewed and are negative.   Allergies  Sulfonamide derivatives; Hydrocodone; and Pollen extract  Home Medications   Prior to Admission  medications   Medication Sig Start Date End Date Taking? Authorizing Provider  ALPRAZolam (XANAX) 0.25 MG tablet Take 1 tablet (0.25 mg total) by mouth 2 (two) times daily as needed for anxiety. 08/19/13   Wilmon Arms, MD  amoxicillin-clavulanate (AUGMENTIN) 875-125 MG tablet Take 1 tablet by mouth 2 (two) times daily. One po bid x 10 days 02/17/15   Noland Fordyce, PA-C  b complex vitamins tablet Take 1 tablet by mouth daily.    Historical Provider, MD  benzonatate (TESSALON) 100 MG capsule Take 1 capsule (100 mg total) by mouth every 8 (eight) hours. 02/17/15   Noland Fordyce, PA-C  bismuth subsalicylate  (PEPTO BISMOL) 262 MG/15ML suspension Take 30 mLs by mouth every 6 (six) hours as needed for indigestion or diarrhea or loose stools.    Historical Provider, MD  calcium-vitamin D (OSCAL WITH D) 500-200 MG-UNIT per tablet Take 1 tablet by mouth.    Historical Provider, MD  citalopram (CELEXA) 20 MG tablet Take 20 mg by mouth daily.    Historical Provider, MD  dexamethasone (DECADRON) 4 MG tablet Take 4 mg by mouth 2 (two) times daily with a meal.    Historical Provider, MD  esomeprazole (NEXIUM) 20 MG capsule Take 20 mg by mouth daily as needed (for acid reflux).     Historical Provider, MD  gabapentin (NEURONTIN) 300 MG capsule Take 300 mg by mouth 3 (three) times daily.    Historical Provider, MD  halobetasol (ULTRAVATE) 0.05 % cream Apply topically 2 (two) times daily. 12/08/13   Jacqulyn Cane, MD  hydrocortisone 2.5 % cream Apply topically 2 (two) times daily.    Historical Provider, MD  loratadine (CLARITIN) 10 MG tablet Take 10 mg by mouth daily.    Historical Provider, MD  Multiple Vitamin (MULTIVITAMIN) capsule Take 1 capsule by mouth daily.    Historical Provider, MD  Omega-3 Fatty Acids (FISH OIL) 1000 MG CAPS Take 1,000 mg by mouth daily.    Historical Provider, MD  oxyCODONE-acetaminophen (PERCOCET/ROXICET) 5-325 MG per tablet Take 1-2 tablets by mouth every 4 (four) hours as needed for moderate pain. 09/30/13   Stark Klein, MD  promethazine (PHENERGAN) 25 MG tablet Take 25 mg by mouth every 6 (six) hours as needed for nausea or vomiting.    Historical Provider, MD  pseudoephedrine (SUDAFED) 60 MG tablet Take 1 tablet (60 mg total) by mouth every 6 (six) hours as needed for congestion. 02/17/15   Noland Fordyce, PA-C   Meds Ordered and Administered this Visit  Medications - No data to display  BP 140/101 mmHg  Pulse 96  Temp(Src) 98.3 F (36.8 C) (Oral)  Resp 16  Wt 195 lb (88.451 kg)  SpO2 99% No data found.   Physical Exam  Constitutional: She appears well-developed and  well-nourished. No distress.  HENT:  Head: Normocephalic and atraumatic.  Right Ear: Hearing, tympanic membrane, external ear and ear canal normal.  Left Ear: Hearing, external ear and ear canal normal. Tympanic membrane is erythematous. Tympanic membrane is not bulging. A middle ear effusion is present.  Nose: Mucosal edema present. Right sinus exhibits maxillary sinus tenderness and frontal sinus tenderness. Left sinus exhibits maxillary sinus tenderness and frontal sinus tenderness.  Mouth/Throat: Uvula is midline and mucous membranes are normal. Posterior oropharyngeal erythema present. No oropharyngeal exudate, posterior oropharyngeal edema or tonsillar abscesses.  Eyes: Conjunctivae are normal. No scleral icterus.  Neck: Normal range of motion. Neck supple.  Cardiovascular: Normal rate, regular rhythm and normal heart sounds.  Pulmonary/Chest: Effort normal and breath sounds normal. No stridor. No respiratory distress. She has no wheezes. She has no rales. She exhibits no tenderness.  Abdominal: Soft. She exhibits no distension and no mass. There is no tenderness. There is no rebound and no guarding.  Musculoskeletal: Normal range of motion.  Lymphadenopathy:    She has no cervical adenopathy.  Neurological: She is alert.  Skin: Skin is warm and dry. She is not diaphoretic.  Nursing note and vitals reviewed.   ED Course  Procedures (including critical care time)  Labs Review Labs Reviewed - No data to display  Imaging Review No results found.    MDM   1. Acute rhinosinusitis   2. Acute upper respiratory infection     Pt c/o worsening nasal congestion with frontal headache, Left ear pain and cough.   BP is elevated, likely due to pt taking OTC cough/cold medications.  Pt also notes her BP is high whenever she goes to the doctor's office.  Will tx for bacterial cause  Rx: Augmentin, Tessalon, Sudafed (pt requested prescription for sudafed as it has helped in the  past)  Encouraged fluids and rest. May alternate acetaminophen and ibuprofen for fever or pain.   F/u with PCP in 7-10 days if not improving, sooner if worsening. Patient verbalized understanding and agreement with treatment plan.      Noland Fordyce, PA-C 02/17/15 1008

## 2015-02-17 NOTE — ED Notes (Addendum)
Pt c/o 3 weeks of nasal congestion, productive cough with colored sputum. Taken Dayquil. Reports blood pressure cuffs hurt her arm and cause her BP to be high, "always high in Dr. Gabriel Carina and not at home". BP 140/101

## 2015-05-04 IMAGING — CT NM PET TUM IMG INITIAL (PI) SKULL BASE T - THIGH
8 series · 25 of 25 positions shown · non-contrast
Comparison: Breast MRI of 08/18/2013.

CLINICAL DATA: Initial treatment strategy for right-sided breast
cancer..

EXAM:
NUCLEAR MEDICINE PET SKULL BASE TO THIGH
TECHNIQUE: 10.2 mCi F-18 FDG was injected intravenously. Full-ring PET imaging
was performed from the skull base to thigh after the radiotracer. CT
data was obtained and used for attenuation correction and anatomic
localization.
FASTING BLOOD GLUCOSE:  Value: 110 mg/dl

[Series 3: pet sk_thigh ac · axial · 5.0mm · 4.07mm/px · z∈[-1311,-423]mm · 5 of 223 slices shown]
[im 1/223]
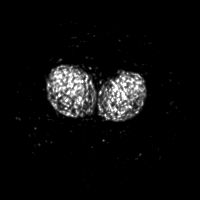
[im 56/223]
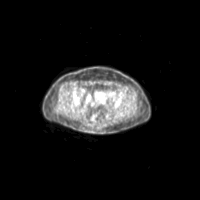
[im 112/223]
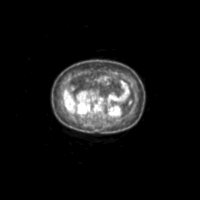
[im 167/223]
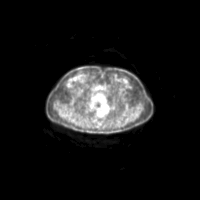
[im 223/223]
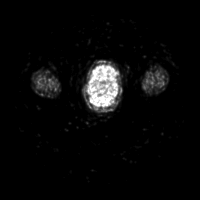

[Series 4: ct sk_thigh 5.0 b31f · axial · 5.0mm · 0.98mm/px · z∈[-1311,-423]mm · 5 of 223 slices shown]
[im 1/223]
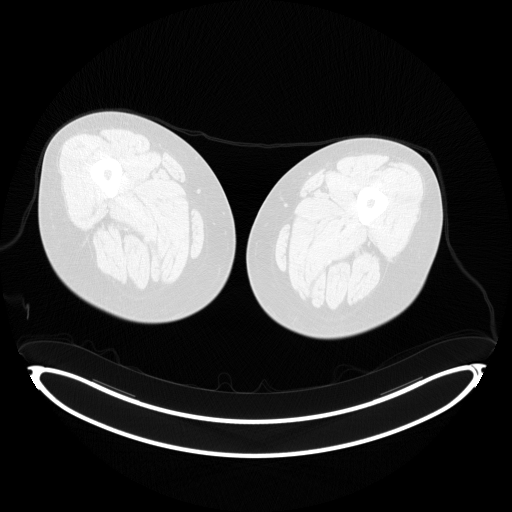
[im 56/223]
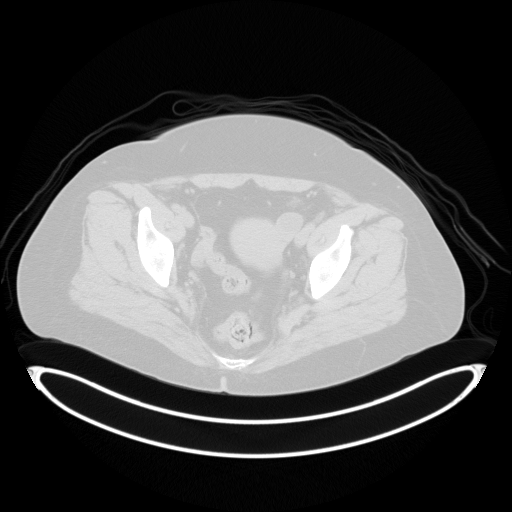
[im 112/223]
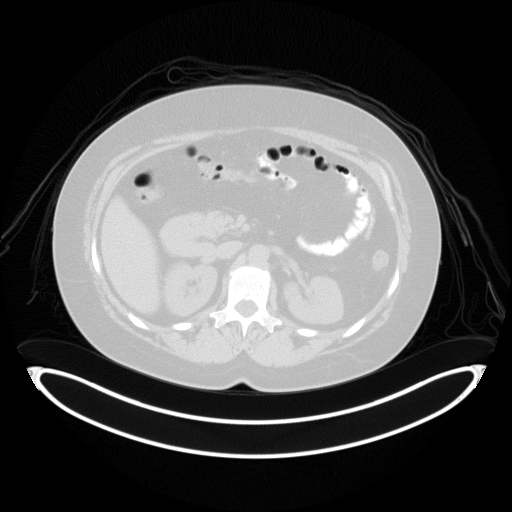
[im 167/223]
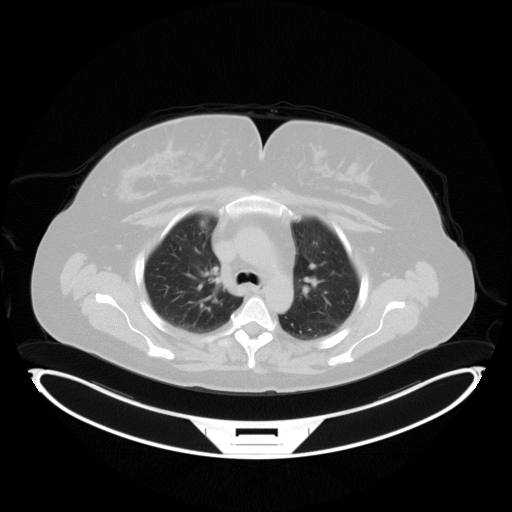
[im 223/223  brain]
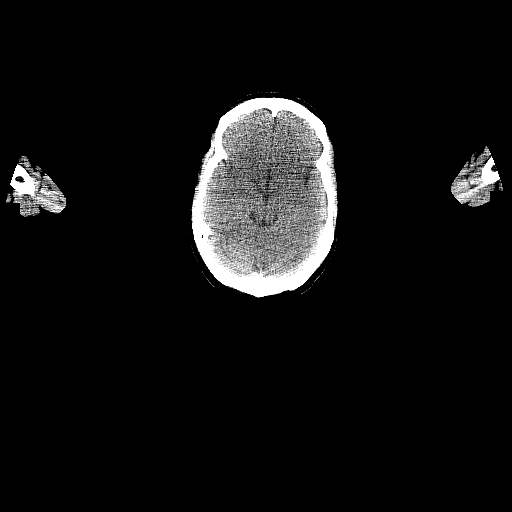

[Series 6: ct sk_thigh 5.0 b70f (id)_bone · axial · 5.0mm · 0.70mm/px · 1 of 62 slices shown]
[im 1/62  bone]
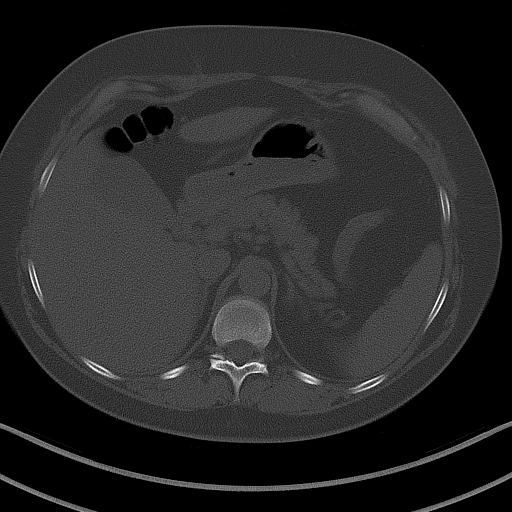

[Series 8: pet sk_thigh nac · axial · 5.0mm · 4.07mm/px · z∈[-1311,-423]mm · 5 of 223 slices shown]
[im 1/223]
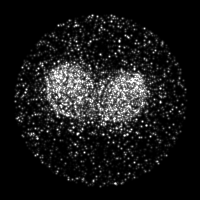
[im 56/223]
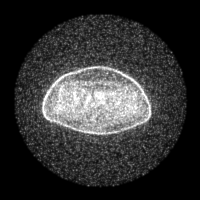
[im 112/223]
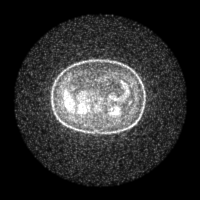
[im 167/223]
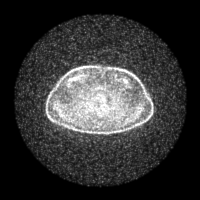
[im 223/223]
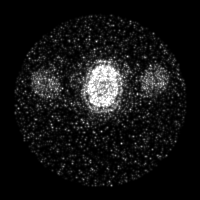

[Series 604: mip collection<mip range> · coronal · 1.84mm/px · 1 of 32 slices shown]
[im 1/32]
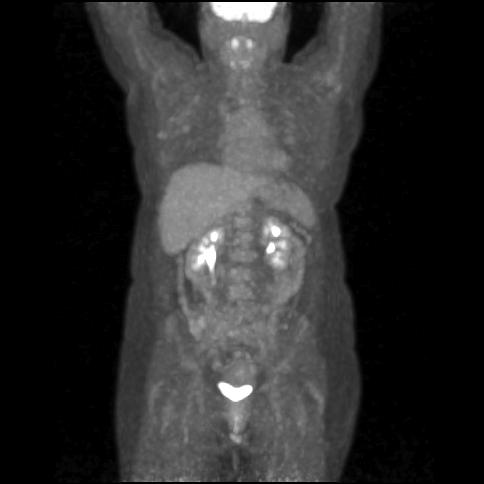

[Series 605: range-ct sk_thigh 5.0 (id)<alpha range> · 2 of 81 slices shown (1 of 2)]
[im 1/81]
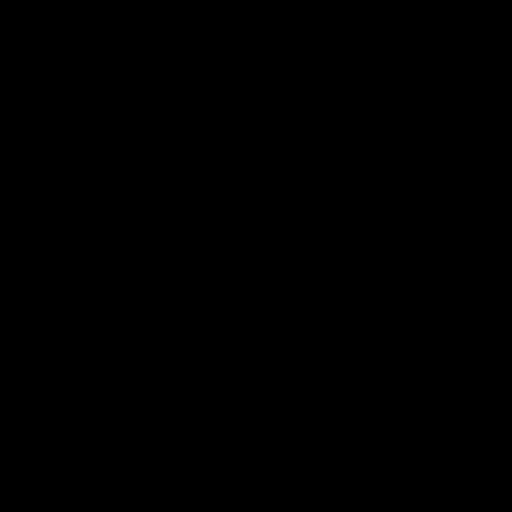
[im 81/81]
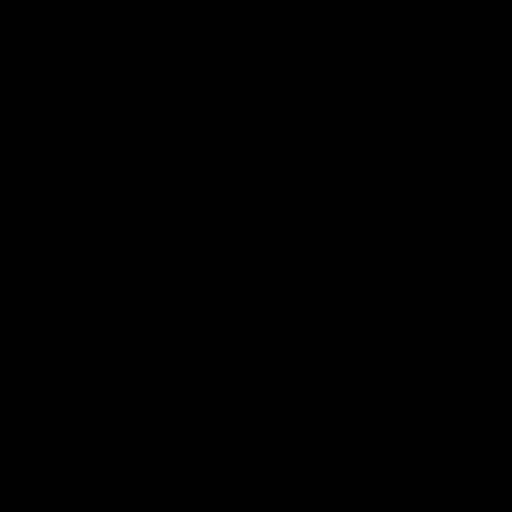

[Series 606: range-ct sk_thigh 5.0 (id)<alpha range> · 5 of 200 slices shown (2 of 2)]
[im 1/200]
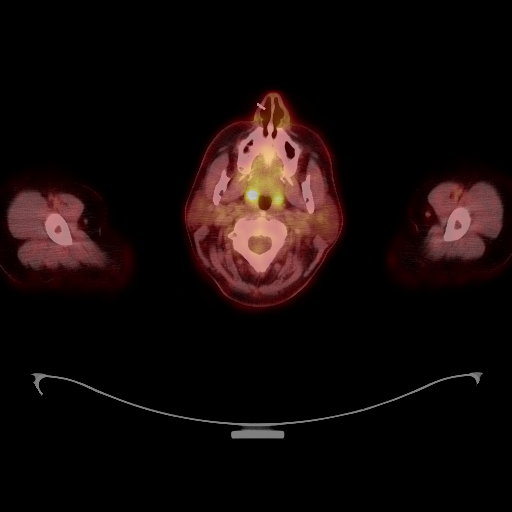
[im 50/200]
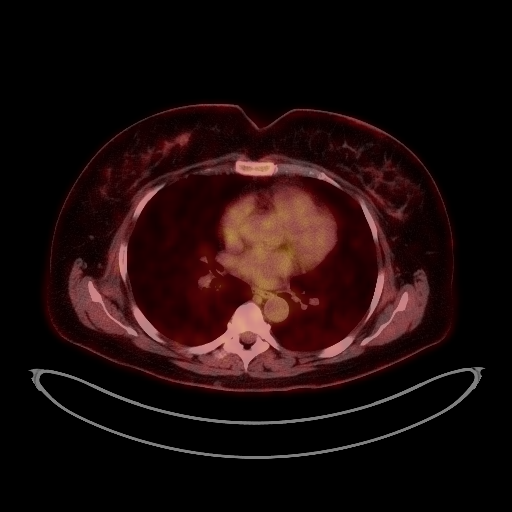
[im 100/200]
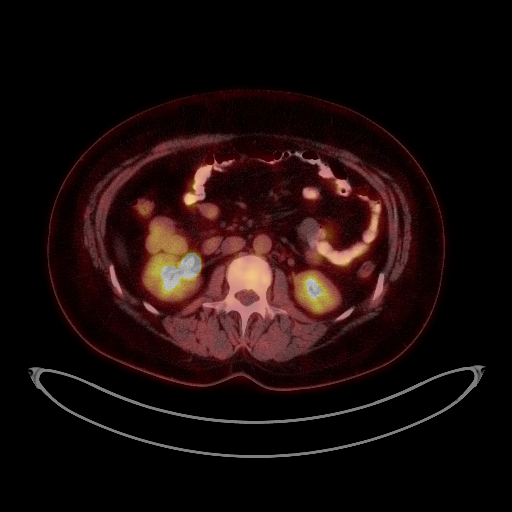
[im 150/200]
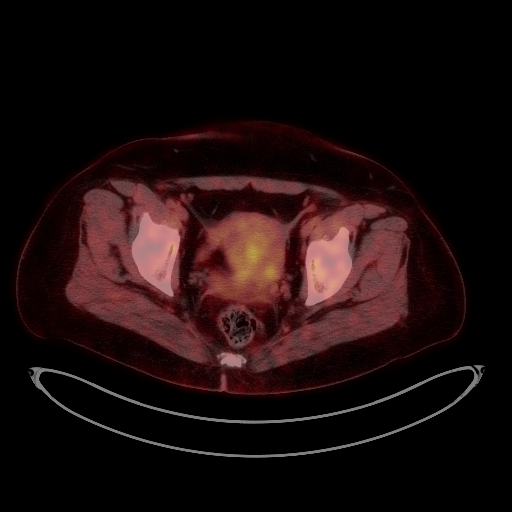
[im 200/200]
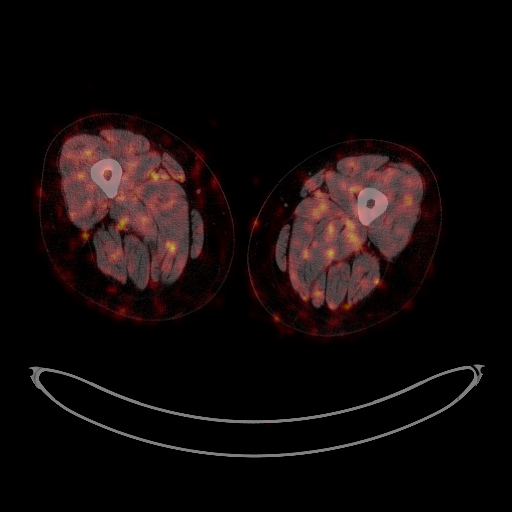

[Series 1032: results mm oncology reading · 4.0mm · 1.05mm/px · 1 of 1 slices shown]
[im 1/1]
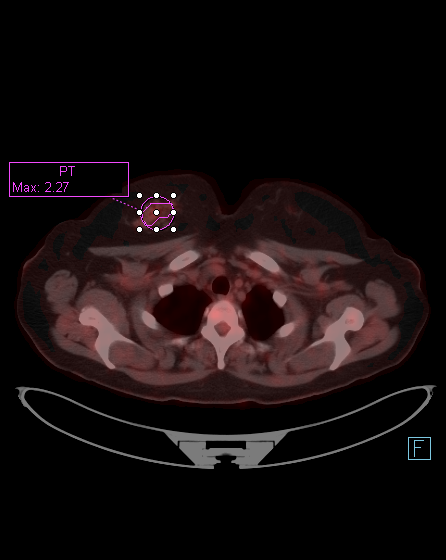

[25 of 25 positions shown; findings below may reference images not displayed]

FINDINGS: NECK

No areas of abnormal hypermetabolism.

CHEST

Low-level hyper metabolism with asymmetric soft tissue density
within the superior right breast on image 45. This likely
corresponds to at least a portion of the right breast primary.

No hypermetabolic axillary lymph nodes. No hypermetabolic nodes in
the mediastinum or internal mammary stations.

ABDOMEN/PELVIS

No areas of abnormal hypermetabolism.

SKELETON

No abnormal marrow activity.

CT IMAGES PERFORMED FOR ATTENUATION CORRECTION

Right nasal piercing. Clear lungs. No thoracic adenopathy. Lack of
transverse duodenum crossing the midline. Normal position of
ileocecal valve.
IMPRESSION: 1. No evidence of hypermetabolic axillary or distant metastasis.
2. Lack of normal position of the duodenal/jejunal junction.
Question a component of incomplete small bowel rotation.

## 2015-11-02 ENCOUNTER — Other Ambulatory Visit: Payer: Self-pay | Admitting: Obstetrics and Gynecology

## 2018-04-08 ENCOUNTER — Emergency Department
Admission: EM | Admit: 2018-04-08 | Discharge: 2018-04-08 | Disposition: A | Discharge status: Home / Self Care | Payer: BLUE CROSS/BLUE SHIELD | Source: Home / Self Care

## 2018-04-08 ENCOUNTER — Encounter: Payer: Self-pay | Admitting: *Deleted

## 2018-04-08 ENCOUNTER — Other Ambulatory Visit: Payer: Self-pay

## 2018-04-08 DIAGNOSIS — Z23 Encounter for immunization: Secondary | ICD-10-CM | POA: Diagnosis not present

## 2018-04-08 DIAGNOSIS — S0101XA Laceration without foreign body of scalp, initial encounter: Secondary | ICD-10-CM | POA: Diagnosis not present

## 2018-04-08 MED ORDER — TETANUS-DIPHTH-ACELL PERTUSSIS 5-2.5-18.5 LF-MCG/0.5 IM SUSP
0.5000 mL | Freq: Once | INTRAMUSCULAR | Status: AC
  Administered 2018-04-08: 0.5 mL via INTRAMUSCULAR

## 2018-04-08 NOTE — ED Triage Notes (Signed)
Ptc/o laceration on the top of her head x 0130. She is unsure of what she hit her head on and if she had LOC because she was intoxicated. Tdap unknown.

## 2018-04-08 NOTE — Discharge Instructions (Addendum)
You may rinse your chest scalp tonight but do not use shampoo on the wound for several days.  Stay out of the swimming pool for at least 5 days.  Return in 10 days for staple removal  Return sooner if any concerns it is getting infected

## 2018-04-08 NOTE — ED Provider Notes (Signed)
[facility]    CSN: [id number] Arrival date & time: 04/08/18  1233     History   Chief Complaint Chief Complaint  Patient presents with  . Laceration    HPI Danielle Suarez is a 53 y.o. female.   HPI Patient has a mother who is sick in the hospital and she was very stressed out last night.  When she got home she got drunk.  Sometime during the night she fell, probably around 2 AM.  Was not aware that she had hit her head.  This morning she woke up with a wound on the top of her scalp.  She is fully alert and oriented except she has a headache left over from the passing out.  Her son reports that she is very functional and normal today.  She did not want to go to a doctor but he insisted. Past Medical History:  Diagnosis Date  . Abnormal Pap smear   . Anxiety   . Arthritis    hips, knees  . Asthma    seasonal allergies, used inhaler only for bronchitis one- two  yrs. ago    . Breast cancer ([place])   . Bruised ribs   . Complication of anesthesia    phenergan has helped in the past   . Condylomata acuminata   . GERD (gastroesophageal reflux disease)    acid reflux, lymphocytic colitis   . Headache   . HGSIL (high grade squamous intraepithelial dysplasia)   . Hot flashes   . LGSIL (low grade squamous intraepithelial dysplasia)   . PONV (postoperative nausea and vomiting)     Patient Active Problem List   Diagnosis Date Noted  . Breast cancer ([place]) 09/29/2013  . Breast cancer of upper-outer quadrant of right female breast ([place]) 08/13/2013  . ACUTE MAXILLARY SINUSITIS 02/13/2010  . UPPER RESPIRATORY INFECTION, ACUTE 02/13/2010  . ALLERGIC RHINITIS, SEASONAL 02/13/2010    Past Surgical History:  Procedure Laterality Date  . CERVICAL CONE BIOPSY     negative   . COLPOSCOPY    . DILATION AND CURETTAGE OF UTERUS    . MASTECTOMY COMPLETE / SIMPLE W/ SENTINEL NODE BIOPSY Right 09/29/2013  . MASTECTOMY W/ SENTINEL NODE BIOPSY Right 09/29/2013   Procedure: RIGHT  MASTECTOMY WITH RIGHT AXILLARY SENTINEL LYMPH NODE BIOPSY;  Surgeon: Stark Klein, MD;  Location: [facility];  Service: General;  Laterality: Right;  . PORTACATH PLACEMENT  09/29/2013  . PORTACATH PLACEMENT Left 09/29/2013   Procedure: INSERTION PORT-A-CATH;  Surgeon: Stark Klein, MD;  Location: [facility];  Service: General;  Laterality: Left;    OB History    Gravida  4   Para  2   Term      Preterm      AB      Living        SAB      TAB      Ectopic      Multiple      Live Births               Home Medications    Prior to Admission medications   Medication Sig Start Date End Date Taking? Authorizing Provider  exemestane (AROMASIN) 25 MG tablet exemestane 25 mg tablet 07/17/17  Yes [provider]  levothyroxine (SYNTHROID) 100 MCG tablet Synthroid 100 mcg tablet 03/16/15  Yes [provider]  omeprazole (PRILOSEC) 40 MG capsule omeprazole 40 mg capsule,delayed release 02/23/18  Yes [provider]    Family  History Family History  Problem Relation Age of Onset  . Diabetes Neg Hx   . Hypertension Neg Hx     Social History Social History   Tobacco Use  . Smoking status: Never Smoker  . Smokeless tobacco: Never Used  Substance Use Topics  . Alcohol use: Yes    Comment: 2 glasses of wine   . Drug use: No     Allergies   Levofloxacin; Sulfonamide derivatives; Hydrocodone; Other; and Pollen extract   Review of Systems Review of Systems Feels like she has a little hangover, otherwise is fine.  Physical Exam Triage Vital Signs ED Triage Vitals  Enc Vitals Group     BP 04/08/18 1343 (!) 144/94     Pulse Rate 04/08/18 1343 94     Resp 04/08/18 1343 18     Temp 04/08/18 1343 99 F (37.2 C)     Temp Source 04/08/18 1343 Oral     SpO2 04/08/18 1343 98 %     Weight 04/08/18 1358 188 lb (85.3 kg)     Height 04/08/18 1358 5\' 9"  (1.753 m)     Head Circumference --      Peak Flow --      Pain Score 04/08/18 1345 6     Pain Loc --       Pain Edu? --      Excl. in [place]? --    No data found.  Updated Vital Signs BP (!) 144/94 (BP Location: Right Arm)   Pulse 94   Temp 99 F (37.2 C) (Oral)   Resp 18   Ht 5\' 9"  (1.753 m)   Wt 85.3 kg   LMP 09/18/2013   SpO2 98%   BMI 27.76 kg/m   Visual Acuity Right Eye Distance:   Left Eye Distance:   Bilateral Distance:    Right Eye Near:   Left Eye Near:    Bilateral Near:     Physical Exam Alert and oriented.  Speech clear.  Good movement of all extremities.  On the top of her scalp is a 4 cm laceration, deep through the scalp tissue.  It had a little crusted blood which the nurse had cleaned and then I debrided the little bit more.  UC Treatments / Results  Labs (all labs ordered are listed, but only abnormal results are displayed) Labs Reviewed - No data to display  EKG None  Radiology No results found.  Procedures Procedures (including critical care time) Laceration repair: Debrided some crusted blood.  1% lidocaine with epinephrine to numb the wound.  Staples were used to close the 4 cm wound.  Patient tolerated well. Medications Ordered in UC Medications  Tdap (BOOSTRIX) injection 0.5 mL (0.5 mLs Intramuscular Given 04/08/18 1404)    Initial Impression / Assessment and Plan / UC Course  I have reviewed the triage vital signs and the nursing notes.  Pertinent labs & imaging results that were available during my care of the patient were reviewed by me and considered in my medical decision making (see chart for details).     Scalp laceration secondary to acute intoxication.  Advised her son that since we do not know of any details of this if he sees any neurologic symptoms at all they are to get help immediately.  Think she is fine to function is normal. Final Clinical Impressions(s) / UC Diagnoses   Final diagnoses:  Laceration of scalp, initial encounter     Discharge Instructions     You  may rinse your chest scalp tonight but do not use  shampoo on the wound for several days.  Stay out of the swimming pool for at least 5 days.  Return in 10 days for staple removal  Return sooner if any concerns it is getting infected      ED Prescriptions    None     Controlled Substance Prescriptions [place] Controlled Substance Registry consulted? No   Posey Boyer, MD 04/08/18 1506

## 2018-04-21 ENCOUNTER — Emergency Department (INDEPENDENT_AMBULATORY_CARE_PROVIDER_SITE_OTHER)
Admission: EM | Admit: 2018-04-21 | Discharge: 2018-04-21 | Disposition: A | Discharge status: Home / Self Care | Payer: BLUE CROSS/BLUE SHIELD | Source: Home / Self Care

## 2018-04-21 ENCOUNTER — Encounter: Payer: Self-pay | Admitting: *Deleted

## 2018-04-21 DIAGNOSIS — Z4802 Encounter for removal of sutures: Secondary | ICD-10-CM

## 2018-04-21 NOTE — Discharge Instructions (Addendum)
You can wash this area as desired.  Return if needed.

## 2018-04-21 NOTE — ED Triage Notes (Signed)
Patient is here for staple removal from her head. No s/s of infection. 6 staples present. Dr. Linna Darner noted there were ready to come out. 6 staples removed without complication.

## 2018-10-17 ENCOUNTER — Other Ambulatory Visit: Payer: Self-pay

## 2018-10-21 ENCOUNTER — Ambulatory Visit: Payer: BC Managed Care – PPO | Admitting: Obstetrics and Gynecology

## 2018-10-21 ENCOUNTER — Encounter: Payer: Self-pay | Admitting: Obstetrics and Gynecology

## 2018-10-21 ENCOUNTER — Other Ambulatory Visit: Payer: Self-pay

## 2018-10-21 ENCOUNTER — Telehealth: Payer: Self-pay | Admitting: Obstetrics and Gynecology

## 2018-10-21 VITALS — BP 116/68 | HR 76 | Temp 97.1°F | Resp 12 | Ht 68.25 in

## 2018-10-21 DIAGNOSIS — Z113 Encounter for screening for infections with a predominantly sexual mode of transmission: Secondary | ICD-10-CM | POA: Diagnosis not present

## 2018-10-21 DIAGNOSIS — N950 Postmenopausal bleeding: Secondary | ICD-10-CM

## 2018-10-21 DIAGNOSIS — Z01419 Encounter for gynecological examination (general) (routine) without abnormal findings: Secondary | ICD-10-CM

## 2018-10-21 DIAGNOSIS — Z853 Personal history of malignant neoplasm of breast: Secondary | ICD-10-CM

## 2018-10-21 NOTE — Telephone Encounter (Signed)
Left message to call Sharee Pimple, RN at [facility].    Per review of Epic, last MMG at [facility] 08/25/2014. Per review of today's OV note, normal MMG 2019 per patient.  Please confirm location of last MMG.   Orders placed for Dx left breast MMG and left breast US at [facility].

## 2018-10-21 NOTE — Progress Notes (Signed)
53 y.o. [id number] Divorced Caucasian female here as a new patient for an annual exam. Patient has seen [facility] in the past. Hx of abnormal paps and breast cancer  Worried about bone loss since taking Aromasin.  Stopped Omeprazole due to this.  Increasing her exercise.   She has some brown discharge in her pad once in a while.  Uncertain if it was blood or urine.   Lost a lot of weight since her mother died.   Mom died 3 months ago.  Father cares for her sister with Downs Syndrome.  Patient works at [employer].  Urine dip - negative.   PCP: [facility]    Patient's last menstrual period was 09/18/2013.           Sexually active: No.  Not sexually active for a long time.  The current method of family planning is post menopausal status.    Exercising: Yes.    walking Smoker:  no  Health Maintenance: Pap:  2019 normal per patient done at [facility]. History of abnormal Pap:  Yes, patient has had hx of HPV; colposcopy and conization done in the past MMG:  2019 normal per patient -- patient has had hx of breast cancer and right breast mastectomy Colonoscopy:  About 2019 per patient normal with Dr. Penelope Coop BMD:   2019 done with Dr. Leo Grosser  Result  Osteopenia per patient with 12% bone loss.  TDaP:  04/08/2018 HIV: negative in the past Screening Labs: discuss if needed   reports that she has never smoked. She has never used smokeless tobacco. She reports previous alcohol use. She reports that she does not use drugs.  Past Medical History:  Diagnosis Date  . Abnormal Pap smear   . Anxiety   . Arthritis    hips, knees  . Asthma    seasonal allergies, used inhaler only for bronchitis one- two  yrs. ago    . Breast cancer ([place])   . Bruised ribs   . Complication of anesthesia    phenergan has helped in the past   . Condylomata acuminata   . GERD (gastroesophageal reflux disease)    acid reflux, lymphocytic colitis   . Headache   . HGSIL (high grade squamous  intraepithelial dysplasia)   . Hot flashes   . LGSIL (low grade squamous intraepithelial dysplasia)   . PONV (postoperative nausea and vomiting)     Past Surgical History:  Procedure Laterality Date  . CERVICAL CONE BIOPSY     negative   . CERVIX LESION DESTRUCTION    . COLPOSCOPY    . DILATION AND CURETTAGE OF UTERUS    . MASTECTOMY COMPLETE / SIMPLE W/ SENTINEL NODE BIOPSY Right 09/29/2013  . MASTECTOMY W/ SENTINEL NODE BIOPSY Right 09/29/2013   Procedure: RIGHT MASTECTOMY WITH RIGHT AXILLARY SENTINEL LYMPH NODE BIOPSY;  Surgeon: Stark Klein, MD;  Location: [place];  Service: General;  Laterality: Right;  . PORTACATH PLACEMENT  09/29/2013  . PORTACATH PLACEMENT Left 09/29/2013   Procedure: INSERTION PORT-A-CATH;  Surgeon: Stark Klein, MD;  Location: [facility];  Service: General;  Laterality: Left;    Current Outpatient Medications  Medication Sig Dispense Refill  . ALPRAZolam (XANAX) 1 MG tablet TK 1/2 TO 1 T PO BID PRA    . CALCIUM PO Take by mouth daily.    Marland Kitchen exemestane (AROMASIN) 25 MG tablet exemestane 25 mg tablet    . levothyroxine (SYNTHROID) 100 MCG tablet Synthroid 100 mcg tablet    . Multiple  Vitamin (MULTIVITAMIN) tablet Take 1 tablet by mouth daily.     No current facility-administered medications for this visit.     Family History  Problem Relation Age of Onset  . Down syndrome Sister   . Alzheimer's disease Sister   . Diabetes Neg Hx   . Hypertension Neg Hx     Review of Systems  Constitutional: Negative.   HENT: Negative.   Eyes: Negative.   Respiratory: Negative.   Cardiovascular: Negative.   Gastrointestinal: Negative.   Endocrine: Negative.   Genitourinary: Negative.   Musculoskeletal: Negative.   Skin: Negative.   Allergic/Immunologic: Negative.   Neurological: Negative.   Hematological: Negative.   Psychiatric/Behavioral: Negative.     Exam:   BP 116/68 (BP Location: Left Arm, Patient Position: Sitting, Cuff Size: Normal)   Pulse 76   Temp (!)  97.1 F (36.2 C) (Temporal)   Resp 12   Ht 5' 8.25" (1.734 m)   LMP 09/18/2013   BMI 28.38 kg/m     General appearance: alert, cooperative and appears stated age Head: normocephalic, without obvious abnormality, atraumatic Neck: no adenopathy, supple, symmetrical, trachea midline and thyroid normal to inspection and palpation Lungs: clear to auscultation bilaterally Breasts: right breast absent.  Left breast - normal appearance, no masses or tenderness, No nipple retraction or dimpling, No nipple discharge or bleeding, No axillary adenopathy Heart: regular rate and rhythm Abdomen: soft, non-tender; no masses, no organomegaly Extremities: extremities normal, atraumatic, no cyanosis or edema Skin: skin color, texture, turgor normal. No rashes or lesions Lymph nodes: cervical, supraclavicular, and axillary nodes normal. Neurologic: grossly normal  Pelvic: External genitalia:  no lesions              No abnormal inguinal nodes palpated.              Urethra:  normal appearing urethra with no masses, tenderness or lesions              Bartholins and Skenes: normal                 Vagina: normal appearing vagina with normal color and discharge, no lesions              Cervix: no lesions.  Consistent with prior conization.               Pap taken: No. Bimanual Exam:  Uterus:  normal size, contour, position, consistency, mobility, non-tender              Adnexa: no mass, fullness, tenderness              Rectal exam: Yes.  .  Confirms.              Anus:  normal sphincter tone, no lesions  Chaperone was present for exam.  Assessment:   Well woman visit with normal exam. Status post right mastectomy for breast cancer.  Hx HGSIL. Status post conization of cervix.  Possible postmenopausal bleeding.  Osteopenia.   Plan: Mammogram dx left -  will start now at [facility]. Self breast awareness reviewed. Pap and HR HPV as above. Guidelines for Calcium, Vitamin D, regular exercise  program including cardiovascular and weight bearing exercise. BMD next year.  Labs with PCP. Return for pelvic US. Flu vaccine recommended. Hep C and HIV screening.  She declines other STD screening.  Follow up annually and prn.   After visit summary provided.

## 2018-10-21 NOTE — Patient Instructions (Signed)

## 2018-10-21 NOTE — Telephone Encounter (Signed)
Please schedule a diagnostic mammogram at [facility].  Hx right breast cancer, status post mastectomy.   She is available the week of Labor Day:  Tuesday afternoon, Wed morning, Friday any time.   Thanks.

## 2018-10-22 LAB — HIV ANTIBODY (ROUTINE TESTING W REFLEX): HIV Screen 4th Generation wRfx: NONREACTIVE

## 2018-10-22 LAB — HEPATITIS C ANTIBODY: Hep C Virus Ab: <0.1 s/co ratio (ref 0.0–0.9)

## 2018-10-23 NOTE — Telephone Encounter (Signed)
Left message to call Sharee Pimple, RN at [place].    Per review of Epic patient is scheduled at [facility] on 11/04/2018 at 1:10pm.

## 2018-10-23 NOTE — Telephone Encounter (Signed)
Call returned to patient, left detailed message, ok per dpr, name identified on voicemail.  Advised patient to contact [facility] to reschedule breast imaging to a later date, they have recived previous imaging records. May also return call to office at [phone] if additional assistance is needed or any additional questions.   Routing to provider for final review. Patient is agreeable to disposition. Will close encounter.

## 2018-10-23 NOTE — Telephone Encounter (Signed)
Spoke with Danielle Suarez at [facility]. Was advised they have spoken with the patient and have requested previous breast imaging records from 10/2017 at [facility]. Patient does need to return call to [facility] to reschedule breast imaging because it will be too early to have screening.

## 2018-10-23 NOTE — Telephone Encounter (Signed)
Patient returned call

## 2018-10-31 ENCOUNTER — Telehealth: Payer: Self-pay | Admitting: Obstetrics and Gynecology

## 2018-10-31 NOTE — Telephone Encounter (Signed)
Patient cancelled ultrasound that was scheduled 9/10 and would like a call to reschedule.

## 2018-10-31 NOTE — Telephone Encounter (Signed)
Left message to call Sharee Pimple, RN at [place].    PUS for PMB

## 2018-11-04 ENCOUNTER — Other Ambulatory Visit: Payer: BLUE CROSS/BLUE SHIELD

## 2018-11-05 NOTE — Telephone Encounter (Signed)
Left message to call Sufyan Meidinger, RN at [facility] [phone].   

## 2018-11-05 NOTE — Telephone Encounter (Signed)
Pelvic ultrasound and possible endometrial biopsy recommended.   Chart to nursing supervisor.   Cc- Lamont Snowball

## 2018-11-05 NOTE — Telephone Encounter (Signed)
Patient returned call

## 2018-11-05 NOTE — Telephone Encounter (Signed)
Returned call to patient. RN advised need to reschedule PUS for possible PMB. Patient states "I'm not bleeding. I tried to explain that." States she has random occurrence of a "brown wine" color she will notice on her pad. Patient declines to reschedule PUS at this time. States she has just gone back to work and will need to schedule PUS for a day she can take vacation. States she will need to review with supervisor and return call next month. RN advised patient of the importance of PUS to follow up on possible PMB. Patient states, "I just don't feel like this is necessary. Maybe I'm wrong." RN again advised patient that PUS is next step in care to evaluate the bleeding. Patient verbalized understanding, but states she will still need to review her schedule with her supervisor and return call. RN advised would update Dr. Quincy Simmonds. Patient agreeable.   Routing to provider for review.

## 2018-11-06 ENCOUNTER — Other Ambulatory Visit: Payer: BC Managed Care – PPO | Admitting: Obstetrics and Gynecology

## 2018-11-06 ENCOUNTER — Other Ambulatory Visit: Payer: BC Managed Care – PPO

## 2018-11-26 ENCOUNTER — Other Ambulatory Visit: Payer: BLUE CROSS/BLUE SHIELD

## 2018-12-09 ENCOUNTER — Ambulatory Visit
Admission: RE | Admit: 2018-12-09 | Discharge: 2018-12-09 | Disposition: A | Discharge status: Home / Self Care | Payer: BLUE CROSS/BLUE SHIELD | Source: Ambulatory Visit | Attending: Obstetrics and Gynecology | Admitting: Obstetrics and Gynecology

## 2018-12-09 ENCOUNTER — Other Ambulatory Visit: Payer: Self-pay

## 2018-12-09 DIAGNOSIS — Z853 Personal history of malignant neoplasm of breast: Secondary | ICD-10-CM

## 2018-12-31 NOTE — Telephone Encounter (Signed)
Please sent patient a letter recommending pelvic ultrasound and possible endometrial biopsy for suspected postmenopausal bleeding.  Cc- Lamont Snowball

## 2019-01-07 NOTE — Telephone Encounter (Signed)
Standard letter sent by mail and Mychart. Will close encounter.

## 2019-01-12 ENCOUNTER — Telehealth: Payer: Self-pay | Admitting: Obstetrics and Gynecology

## 2019-01-12 NOTE — Telephone Encounter (Signed)
Patient received a letter in the mail to schedule a diagnostic ultrasound, but states she does not need it. Says she is not having any abnormal bleeding.

## 2019-01-14 NOTE — Telephone Encounter (Signed)
Left message to call Morgon Pamer, RN at [facility] [phone].   

## 2019-01-14 NOTE — Telephone Encounter (Signed)
Patient returned a call to [facility].. She said " I do not feel that I need to have the procedure done and she does not need to return my call".

## 2019-01-14 NOTE — Telephone Encounter (Signed)
Standard letter was sent to patient recommending PUS for PMB. See patient message below.   Routing to Dr. Quincy Simmonds to review.   Cc: Lamont Snowball, RN

## 2019-01-14 NOTE — Telephone Encounter (Signed)
Patient is returning call to Jill. °

## 2019-01-14 NOTE — Telephone Encounter (Signed)
Left message to call Rollie Hynek, RN at [facility] [phone].   

## 2019-01-27 NOTE — Telephone Encounter (Signed)
Please send 30 day letter.  Cc- Triage

## 2019-01-28 NOTE — Telephone Encounter (Signed)
Letter signed by me.

## 2019-01-28 NOTE — Telephone Encounter (Signed)
30 day letter placed on Dr Elza Rafter desk for review and to be signed.

## 2019-01-29 NOTE — Telephone Encounter (Signed)
30 day letter signed and mailed today. Encounter closed.

## 2019-02-02 ENCOUNTER — Encounter: Payer: Self-pay | Admitting: Obstetrics and Gynecology

## 2019-02-02 ENCOUNTER — Telehealth: Payer: Self-pay | Admitting: Obstetrics and Gynecology

## 2019-02-02 NOTE — Telephone Encounter (Signed)
Patient called asking to talk with someone about the 30 day letter she received from our office. She said "I feel that I do not need this procedure. I am not having any bleeding and the letter stated I would be dismissed from the practice unless I have this procedure done".

## 2019-02-02 NOTE — Telephone Encounter (Signed)
Return call to patient. Patient has received letter and insists she is not having vaginal bleeding and procedure is not needed.  Advised that due to report of brown discharge on pad, we must rule out vaginal bleeding and assume bleeding until proven otherwise. Discussed standard of care for this.  Patient states she is sure this is stool and she feels forced into a test she does not need. Advised she is not forced into anything. Dr Quincy Simmonds and she discussed plan of care to which patient was agreeable while in office. Since that time, patient has declined to proceed, which is fine but our office needs to comply with standard of care for evaluation.  After 10 minute call with Glorianne Manchester RN in room, patient states forget it, I do not want to return there. Call disconnected.   Routing to Dr Quincy Simmonds. Encounter closed.

## 2020-12-27 ENCOUNTER — Other Ambulatory Visit: Payer: Self-pay | Admitting: Obstetrics and Gynecology

## 2020-12-27 ENCOUNTER — Other Ambulatory Visit: Payer: Self-pay | Admitting: Obstetrics & Gynecology

## 2020-12-29 ENCOUNTER — Other Ambulatory Visit: Payer: Self-pay | Admitting: Obstetrics & Gynecology

## 2020-12-29 ENCOUNTER — Other Ambulatory Visit: Payer: Self-pay | Admitting: Surgery

## 2020-12-29 DIAGNOSIS — N6489 Other specified disorders of breast: Secondary | ICD-10-CM

## 2021-01-07 ENCOUNTER — Ambulatory Visit
Admission: RE | Admit: 2021-01-07 | Discharge: 2021-01-07 | Disposition: A | Discharge status: Home / Self Care | Payer: BC Managed Care – PPO | Source: Ambulatory Visit | Attending: Obstetrics & Gynecology | Admitting: Obstetrics & Gynecology

## 2021-01-07 ENCOUNTER — Other Ambulatory Visit: Payer: Self-pay | Admitting: Obstetrics & Gynecology

## 2021-01-07 ENCOUNTER — Other Ambulatory Visit: Payer: Self-pay

## 2021-01-07 DIAGNOSIS — N6489 Other specified disorders of breast: Secondary | ICD-10-CM

## 2021-01-23 ENCOUNTER — Other Ambulatory Visit: Payer: BC Managed Care – PPO

## 2023-11-27 DEATH — deceased
